# Patient Record
Sex: Female | Born: 1980 | Race: White | Hispanic: No | Marital: Married | Smoking: Never smoker
Health system: Southern US, Community
[De-identification: ages and names within clinical notes are randomized; demographics above are authoritative.]

## PROBLEM LIST (undated history)

## (undated) DIAGNOSIS — N946 Dysmenorrhea, unspecified: Secondary | ICD-10-CM

## (undated) DIAGNOSIS — Z8489 Family history of other specified conditions: Secondary | ICD-10-CM

## (undated) DIAGNOSIS — D649 Anemia, unspecified: Secondary | ICD-10-CM

## (undated) DIAGNOSIS — Z9889 Other specified postprocedural states: Secondary | ICD-10-CM

## (undated) DIAGNOSIS — N390 Urinary tract infection, site not specified: Secondary | ICD-10-CM

## (undated) DIAGNOSIS — K219 Gastro-esophageal reflux disease without esophagitis: Secondary | ICD-10-CM

## (undated) DIAGNOSIS — E282 Polycystic ovarian syndrome: Secondary | ICD-10-CM

## (undated) DIAGNOSIS — R112 Nausea with vomiting, unspecified: Secondary | ICD-10-CM

## (undated) DIAGNOSIS — T7840XA Allergy, unspecified, initial encounter: Secondary | ICD-10-CM

## (undated) DIAGNOSIS — E162 Hypoglycemia, unspecified: Secondary | ICD-10-CM

## (undated) DIAGNOSIS — O339 Maternal care for disproportion, unspecified: Secondary | ICD-10-CM

## (undated) DIAGNOSIS — B019 Varicella without complication: Secondary | ICD-10-CM

## (undated) DIAGNOSIS — R32 Unspecified urinary incontinence: Secondary | ICD-10-CM

## (undated) DIAGNOSIS — N1330 Unspecified hydronephrosis: Secondary | ICD-10-CM

## (undated) DIAGNOSIS — F329 Major depressive disorder, single episode, unspecified: Secondary | ICD-10-CM

## (undated) DIAGNOSIS — A630 Anogenital (venereal) warts: Secondary | ICD-10-CM

## (undated) DIAGNOSIS — F32A Depression, unspecified: Secondary | ICD-10-CM

## (undated) HISTORY — DX: Maternal care for disproportion, unspecified: O33.9

## (undated) HISTORY — DX: Urinary tract infection, site not specified: N39.0

## (undated) HISTORY — PX: APPENDECTOMY: SHX54

## (undated) HISTORY — DX: Unspecified urinary incontinence: R32

## (undated) HISTORY — DX: Anemia, unspecified: D64.9

## (undated) HISTORY — DX: Varicella without complication: B01.9

## (undated) HISTORY — DX: Unspecified hydronephrosis: N13.30

## (undated) HISTORY — DX: Gastro-esophageal reflux disease without esophagitis: K21.9

## (undated) HISTORY — DX: Depression, unspecified: F32.A

## (undated) HISTORY — DX: Anogenital (venereal) warts: A63.0

## (undated) HISTORY — DX: Major depressive disorder, single episode, unspecified: F32.9

## (undated) HISTORY — DX: Hypoglycemia, unspecified: E16.2

## (undated) HISTORY — DX: Allergy, unspecified, initial encounter: T78.40XA

## (undated) HISTORY — DX: Polycystic ovarian syndrome: E28.2

## (undated) HISTORY — PX: OTHER SURGICAL HISTORY: SHX169

## (undated) HISTORY — DX: Dysmenorrhea, unspecified: N94.6

---
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
## 2009-02-24 DIAGNOSIS — O09299 Supervision of pregnancy with other poor reproductive or obstetric history, unspecified trimester: Secondary | ICD-10-CM

## 2009-02-24 HISTORY — PX: CHOLECYSTECTOMY: SHX55

## 2012-06-07 ENCOUNTER — Ambulatory Visit (INDEPENDENT_AMBULATORY_CARE_PROVIDER_SITE_OTHER): Payer: 59 | Admitting: Internal Medicine

## 2012-06-07 ENCOUNTER — Encounter: Payer: Self-pay | Admitting: Internal Medicine

## 2012-06-07 VITALS — BP 90/60 | HR 78 | Temp 98.9°F | Ht 60.5 in | Wt 190.5 lb

## 2012-06-07 DIAGNOSIS — E282 Polycystic ovarian syndrome: Secondary | ICD-10-CM

## 2012-06-07 DIAGNOSIS — K219 Gastro-esophageal reflux disease without esophagitis: Secondary | ICD-10-CM

## 2012-06-07 DIAGNOSIS — Z9109 Other allergy status, other than to drugs and biological substances: Secondary | ICD-10-CM

## 2012-06-07 DIAGNOSIS — E162 Hypoglycemia, unspecified: Secondary | ICD-10-CM

## 2012-06-07 DIAGNOSIS — F32A Depression, unspecified: Secondary | ICD-10-CM

## 2012-06-07 DIAGNOSIS — F329 Major depressive disorder, single episode, unspecified: Secondary | ICD-10-CM

## 2012-06-07 DIAGNOSIS — N1330 Unspecified hydronephrosis: Secondary | ICD-10-CM

## 2012-06-07 MED ORDER — PANTOPRAZOLE SODIUM 40 MG PO TBEC: 40.0000 mg | DELAYED_RELEASE_TABLET | Freq: Every day | ORAL | Status: DC

## 2012-06-25 NOTE — Progress Notes (Signed)
Subjective:    Patient ID: Martha Miranda, female    DOB: 01/29/1981, 32 y.o.   MRN: [id number]  HPI 32 year old female with past history of GERD, PCOS, urinary incontinence and hydronephrosis requiring stent placement during pregnancy.  She comes in today to follow up on these issues as well as to establish care.  Takes zyrtec for her allergies.  Controls relatively well.  Has had allergy testing.  States she was in the [employer] for eight years and moved back here 8/13.  Seeing Dr Jean Rosenthal at [facility] for pap and pelvic.  Has a history of abnormal pap.  Had colposcopy (negative).  Also has a history of genital warts.  She had some issues with her pregnancy.  She had hyperemesis gravidarum and hydronephrosis when pregnant.  Had stents placed.  This occurred at 30 weeks of pregnancy.  Since childbirth, has not had issues with her kidney.  Stent pulled one week after delivery.  Kidney function is normal.  Has had her gallbladder removed.  Has had more regular bowel movements after surgery.  After her gallbladder was removed, she started having some chest pain.  Had EGD - esophagitis and positive H. Pylori.  Treated.  Took protonix.  No dysphagia.  Symptoms better.  Does have some issues with dairy products.  She reports problems with hypoglycemia.  Controls with diet.  No significant issues recently.    Past Medical History  Diagnosis Date  . Allergy   . GERD (gastroesophageal reflux disease)   . Chicken pox   . Depression   . Hypoglycemia   . Urinary incontinence   . UTI (urinary tract infection)   . Hydronephrosis   . Polycystic ovarian syndrome   . Genital warts     Outpatient Encounter Prescriptions as of 06/07/2012  Medication Sig Dispense Refill  . cetirizine (ZYRTEC) 10 MG tablet Take 10 mg by mouth daily.      . Multiple Vitamin (MULTIVITAMIN WITH MINERALS) TABS Take 1 tablet by mouth daily.      . Norgestimate-Ethinyl Estradiol Triphasic (ORTHO TRI-CYCLEN, 28,) 0.18/0.215/0.25 MG-35 MCG  tablet Take 1 tablet by mouth daily.      . pantoprazole (PROTONIX) 40 MG tablet Take 1 tablet (40 mg total) by mouth daily.  90 tablet  1  . [DISCONTINUED] pantoprazole (PROTONIX) 40 MG tablet Take 40 mg by mouth daily.       No facility-administered encounter medications on file as of 06/07/2012.    Review of Systems Patient denies any headache, lightheadedness or dizziness.  Takes zyrtec for her allergies.  No chest pain, tightness or palpitations.  No increased shortness of breath, cough or congestion.  Takes protonix for acid reflux.  No nausea or vomiting.  No abdominal pain or cramping.  No bowel change, such as diarrhea, constipation, BRBPR or melana.  Bowels doing better since her gall bladder surgery.  No urine change.        Objective:   Physical Exam Filed Vitals:   06/07/12 0926  BP: 90/60  Pulse: 78  Temp: 98.9 F (106.66 C)   32 year old female in no acute distress.   HEENT:  Nares- clear.  Oropharynx - without lesions. NECK:  Supple.  Nontender.  No audible bruit.  HEART:  Appears to be regular. LUNGS:  No crackles or wheezing audible.  Respirations even and unlabored.  RADIAL PULSE:  Equal bilaterally.    BREASTS:  Performed by GYN.   ABDOMEN:  Soft, nontender.  Bowel sounds present and  normal.  No audible abdominal bruit.  GU:  Performed by GYN.     EXTREMITIES:  No increased edema present.  DP pulses palpable and equal bilaterally.      SKIN:  No rash noted.     Assessment & Plan:  FAMILY HISTORY OF BREAST CANCER.  Maternal aunt with breast cancer.  Genetic marker negative.    GYN.  Has a history of a previous abnormal pap.  Colposcopy negative.  Is followed by Dr Jean Rosenthal at [facility]. States is up to date.    HEALTH MAINTENANCE.  Breast, pelvic and pap smears through [facility].  Obtain records and recent lab results prior to ordering labs.   I spent 45 minutes with this patient and more than 50% of the time was spent in consultation regarding the above.

## 2012-06-27 ENCOUNTER — Encounter: Payer: Self-pay | Admitting: Internal Medicine

## 2012-06-27 DIAGNOSIS — Z9109 Other allergy status, other than to drugs and biological substances: Secondary | ICD-10-CM | POA: Insufficient documentation

## 2012-06-27 DIAGNOSIS — E282 Polycystic ovarian syndrome: Secondary | ICD-10-CM | POA: Insufficient documentation

## 2012-06-27 DIAGNOSIS — N1330 Unspecified hydronephrosis: Secondary | ICD-10-CM | POA: Insufficient documentation

## 2012-06-27 DIAGNOSIS — K219 Gastro-esophageal reflux disease without esophagitis: Secondary | ICD-10-CM | POA: Insufficient documentation

## 2012-06-27 DIAGNOSIS — F419 Anxiety disorder, unspecified: Secondary | ICD-10-CM | POA: Insufficient documentation

## 2012-06-27 DIAGNOSIS — E162 Hypoglycemia, unspecified: Secondary | ICD-10-CM | POA: Insufficient documentation

## 2012-06-27 NOTE — Assessment & Plan Note (Signed)
Occurred during surgery.  Stent removed one week after delivery.  Doing well. Reports renal function is normal.   

## 2012-06-27 NOTE — Assessment & Plan Note (Signed)
Controls with diet.  Follow.   

## 2012-06-27 NOTE — Assessment & Plan Note (Signed)
Has had allergy testing.  Allergic to ragweed and mold.  Takes zyrtec. Follow.

## 2012-06-27 NOTE — Assessment & Plan Note (Signed)
On ortho tri-cyclen and doing well.  Follow.

## 2012-06-27 NOTE — Assessment & Plan Note (Addendum)
On Protonix.  No increased problems reported. Follow.  Previous EGD revealed esophagitis with positive H. Pylori.  Treated.    

## 2012-06-27 NOTE — Assessment & Plan Note (Signed)
Had some depression when she returned from [place].  Doing well now.  On no medication.  Follow.   

## 2012-07-30 ENCOUNTER — Encounter: Payer: Self-pay | Admitting: Internal Medicine

## 2012-07-30 ENCOUNTER — Ambulatory Visit (INDEPENDENT_AMBULATORY_CARE_PROVIDER_SITE_OTHER): Payer: 59 | Admitting: Internal Medicine

## 2012-07-30 VITALS — BP 100/60 | HR 74 | Temp 98.9°F | Ht 60.5 in | Wt 193.2 lb

## 2012-07-30 DIAGNOSIS — J069 Acute upper respiratory infection, unspecified: Secondary | ICD-10-CM

## 2012-07-30 DIAGNOSIS — J329 Chronic sinusitis, unspecified: Secondary | ICD-10-CM

## 2012-07-30 MED ORDER — AMOXICILLIN 875 MG PO TABS: 875.0000 mg | ORAL_TABLET | Freq: Two times a day (BID) | ORAL | Status: DC

## 2012-07-30 MED ORDER — TRIAMCINOLONE ACETONIDE(NASAL) 55 MCG/ACT NA INHA: 2.0000 | Freq: Every day | NASAL | Status: DC

## 2012-07-30 NOTE — Patient Instructions (Signed)
Amoxicillin (take one 2x/day).  nasacort - 2 sprays each nostril one time per day - do this in the evening.  Saline nasal spray - flush nose at least 2-3x/day.  Afrin nasal spray - 2 sprays each nostril 2x/dayfor three days only then stop.

## 2012-08-01 ENCOUNTER — Encounter: Payer: Self-pay | Admitting: Internal Medicine

## 2012-08-01 NOTE — Progress Notes (Signed)
Subjective:    Patient ID: Martha Miranda, female    DOB: Nov 13, 1980, 32 y.o.   MRN: [id number]  Sinusitis Associated symptoms include coughing.  Ear Fullness  Associated symptoms include coughing.  Cough  32 year old female with past history of GERD, PCOS, urinary incontinence and hydronephrosis requiring stent placement during pregnancy.  She comes in today as a work in with concerns regarding increased congestion and ear fullness associated with cough.  Symptoms started approximately 2-2.5 weeks ago.  Reports increased nasal congestion and sinus pressure.  Left side with increased pressure > right.  Left ear plugged.   Increased drainage.  No sore throat.  Increased cough and congestion.  Has tried sudafed, mucinex and saline.  Not improving.     Past Medical History  Diagnosis Date  . Allergy   . GERD (gastroesophageal reflux disease)   . Chicken pox   . Depression   . Hypoglycemia   . Urinary incontinence   . UTI (urinary tract infection)   . Hydronephrosis   . Polycystic ovarian syndrome   . Genital warts     Outpatient Encounter Prescriptions as of 07/30/2012  Medication Sig Dispense Refill  . Multiple Vitamin (MULTIVITAMIN WITH MINERALS) TABS Take 1 tablet by mouth daily.      . Norgestimate-Ethinyl Estradiol Triphasic (ORTHO TRI-CYCLEN, 28,) 0.18/0.215/0.25 MG-35 MCG tablet Take 1 tablet by mouth daily.      . pantoprazole (PROTONIX) 40 MG tablet Take 1 tablet (40 mg total) by mouth daily.  90 tablet  1  . amoxicillin (AMOXIL) 875 MG tablet Take 1 tablet (875 mg total) by mouth 2 (two) times daily.  20 tablet  0  . cetirizine (ZYRTEC) 10 MG tablet Take 10 mg by mouth daily.      Marland Kitchen triamcinolone (NASACORT AQ) 55 MCG/ACT nasal inhaler Place 2 sprays into the nose daily.  1 Inhaler  1   No facility-administered encounter medications on file as of 07/30/2012.    Review of Systems  Respiratory: Positive for cough.   Patient denies any headache, lightheadedness or dizziness.   Takes zyrtec for her allergies.  Has stopped the zyrtec with the increased congestion.   No chest pain, tightness or palpitations.  No increased shortness of breath.  Does report the increased sinus pressure and nasal congestion.  Increased drainage.  No sore throat.  Increased cough and congestion.  Chest feels tight.  Takes protonix for acid reflux.  No nausea or vomiting.  No abdominal pain or cramping.  No bowel change, such as diarrhea.        Objective:   Physical Exam  Filed Vitals:   07/30/12 0917  BP: 100/60  Pulse: 74  Temp: 98.9 F (36.47 C)   32 year old female in no acute distress.   HEENT:  Nares- erythematous turbinates.  Oropharynx - without lesions.  Increased tenderness to palpation over the maxillary sinus (left).  TMs without erythema.   NECK:  Supple.  Nontender.   HEART:  Appears to be regular. LUNGS:  No crackles or wheezing audible.  Respirations even and unlabored.      Assessment & Plan:  SINUSITIS/URI.  Will treat with amoxicillin 875mg  bid x 10 days.  Saline nasal spray and afrin as directed.  Will try nasacort nasal spray and see if she can tolerate.  mucinex and robitussin as directed.  Follow.  Notify me or be reevaluated if symptoms change, worsen or do not resolve.    FAMILY HISTORY OF BREAST CANCER.  Maternal aunt with breast cancer.  Genetic marker negative.    GYN.  Has a history of a previous abnormal pap.  Colposcopy negative.  Is followed by Dr Jean Rosenthal at [place]. States is up to date.    HEALTH MAINTENANCE.  Breast, pelvic and pap smears through [facility].  Obtain records and recent lab results prior to ordering labs.

## 2012-10-12 ENCOUNTER — Other Ambulatory Visit: Payer: Self-pay | Admitting: *Deleted

## 2012-10-12 MED ORDER — PANTOPRAZOLE SODIUM 40 MG PO TBEC: 40.0000 mg | DELAYED_RELEASE_TABLET | Freq: Every day | ORAL | Status: DC

## 2013-01-05 ENCOUNTER — Telehealth: Payer: Self-pay | Admitting: Internal Medicine

## 2013-01-05 NOTE — Telephone Encounter (Signed)
The patient will run out 11.13.14  triamcinolone (NASACORT AQ) 55 MCG/ACT nasal inhaler

## 2013-01-06 ENCOUNTER — Other Ambulatory Visit: Payer: Self-pay | Admitting: *Deleted

## 2013-01-06 MED ORDER — TRIAMCINOLONE ACETONIDE 55 MCG/ACT NA AERO: 2.0000 | INHALATION_SPRAY | Freq: Every day | NASAL | Status: DC

## 2013-01-06 NOTE — Telephone Encounter (Signed)
Sent Rx electronically

## 2013-01-28 ENCOUNTER — Encounter (INDEPENDENT_AMBULATORY_CARE_PROVIDER_SITE_OTHER): Payer: Self-pay

## 2013-01-28 ENCOUNTER — Ambulatory Visit (INDEPENDENT_AMBULATORY_CARE_PROVIDER_SITE_OTHER): Payer: 59 | Admitting: Internal Medicine

## 2013-01-28 ENCOUNTER — Encounter: Payer: Self-pay | Admitting: Internal Medicine

## 2013-01-28 VITALS — BP 90/60 | HR 70 | Temp 98.2°F | Ht 60.5 in | Wt 197.5 lb

## 2013-01-28 DIAGNOSIS — N63 Unspecified lump in unspecified breast: Secondary | ICD-10-CM

## 2013-01-28 DIAGNOSIS — R928 Other abnormal and inconclusive findings on diagnostic imaging of breast: Secondary | ICD-10-CM

## 2013-01-28 NOTE — Progress Notes (Signed)
Pre-visit discussion using our clinic review tool. No additional management support is needed unless otherwise documented below in the visit note.  

## 2013-01-28 NOTE — Progress Notes (Signed)
  Subjective:    Patient ID: Martha Miranda, female    DOB: [birth year], 32 y.o.   MRN: [id number]  HPI 33 year old female with past history of GERD, PCOS, urinary incontinence and hydronephrosis requiring stent placement during pregnancy.  She comes in today as a work in with concerns regarding a lump in her right breast.  States she first noticed something in her breast 01/16/13.  Describes it as pea sized.  Non tender.  Has not changed.  No nipple discharge.  She has two first cousins (ages 57 and 55) - breast cancer.  Also a maternal aunt who died of breast cancer - age 77.  Another aunt who has breast cancer - reoccurrence.  Her mother had genetic testing and is negative.     Past Medical History  Diagnosis Date  . Allergy   . GERD (gastroesophageal reflux disease)   . Chicken pox   . Depression   . Hypoglycemia   . Urinary incontinence   . UTI (urinary tract infection)   . Hydronephrosis   . Polycystic ovarian syndrome   . Genital warts     Outpatient Encounter Prescriptions as of 01/28/2013  Medication Sig  . cetirizine (ZYRTEC) 10 MG tablet Take 10 mg by mouth daily as needed.   . Multiple Vitamin (MULTIVITAMIN WITH MINERALS) TABS Take 1 tablet by mouth daily.  . Norgestimate-Ethinyl Estradiol Triphasic (ORTHO TRI-CYCLEN, 28,) 0.18/0.215/0.25 MG-35 MCG tablet Take 1 tablet by mouth daily.  . pantoprazole (PROTONIX) 40 MG tablet Take 1 tablet (40 mg total) by mouth daily.  Marland Kitchen triamcinolone (NASACORT) 55 MCG/ACT AERO nasal inhaler Place 2 sprays into the nose daily.  . [DISCONTINUED] amoxicillin (AMOXIL) 875 MG tablet Take 1 tablet (875 mg total) by mouth 2 (two) times daily.    Review of Systems reports noticing a pea sized lump in her breast.  Non tender.  Does breast exams monthly.  This is a change for her.  No change in the size.  No nipple discharge.  Persistent since 01/16/13.        Objective:   Physical Exam  Filed Vitals:   01/28/13 1401  BP: 90/60  Pulse: 70   Temp: 98.2 F (62.56 C)   32 year old female in no acute distress.  NECK:  Supple.  Nontender.  No lymphadenopathy.   BREASTS:  No nipple discharge or nipple retraction.  Palpable pea sized nodule in the right breast - 11:00 position.  Outer breast.  Non tender.  Could not appreciate any other nodules or axillary adenopathy.       Assessment & Plan:  FAMILY HISTORY OF BREAST CANCER.  Maternal aunt with breast cancer.  Genetic marker negative.

## 2013-01-30 ENCOUNTER — Encounter: Payer: Self-pay | Admitting: Internal Medicine

## 2013-01-30 DIAGNOSIS — N63 Unspecified lump in unspecified breast: Secondary | ICD-10-CM | POA: Insufficient documentation

## 2013-01-30 NOTE — Assessment & Plan Note (Signed)
Persistent since 01/16/13 - when she first noticed.  Due to start her period next week.  Unchanged.  Non tender.  Strong family history.  Will check diagnostic mammogram with ultrasound.  Further w/up pending results.

## 2013-02-09 ENCOUNTER — Encounter: Payer: Self-pay | Admitting: Internal Medicine

## 2013-02-09 ENCOUNTER — Ambulatory Visit: Payer: Self-pay | Admitting: Internal Medicine

## 2013-02-09 LAB — HM MAMMOGRAPHY: HM Mammogram: NEGATIVE

## 2013-02-15 ENCOUNTER — Encounter: Payer: Self-pay | Admitting: *Deleted

## 2013-05-03 ENCOUNTER — Telehealth: Payer: Self-pay | Admitting: Internal Medicine

## 2013-05-03 ENCOUNTER — Emergency Department: Payer: Self-pay | Admitting: Emergency Medicine

## 2013-05-03 LAB — COMPREHENSIVE METABOLIC PANEL
ALBUMIN: 3.9 g/dL (ref 3.4–5.0)
ALT: 15 U/L (ref 12–78)
ANION GAP: 4 — AB (ref 7–16)
Alkaline Phosphatase: 95 U/L
BUN: 10 mg/dL (ref 7–18)
Bilirubin,Total: 0.3 mg/dL (ref 0.2–1.0)
CREATININE: 0.73 mg/dL (ref 0.60–1.30)
Calcium, Total: 9 mg/dL (ref 8.5–10.1)
Chloride: 106 mmol/L (ref 98–107)
Co2: 27 mmol/L (ref 21–32)
EGFR (African American): >60
Glucose: 100 mg/dL — ABNORMAL HIGH (ref 65–99)
Osmolality: 273 (ref 275–301)
Potassium: 3.6 mmol/L (ref 3.5–5.1)
SGOT(AST): 16 U/L (ref 15–37)
SODIUM: 137 mmol/L (ref 136–145)
TOTAL PROTEIN: 7.5 g/dL (ref 6.4–8.2)

## 2013-05-03 LAB — CBC WITH DIFFERENTIAL/PLATELET
BASOS PCT: 0.6 %
Basophil #: 0 10*3/uL (ref 0.0–0.1)
EOS ABS: 0.2 10*3/uL (ref 0.0–0.7)
EOS PCT: 3.2 %
HCT: 42.6 % (ref 35.0–47.0)
HGB: 14.5 g/dL (ref 12.0–16.0)
Lymphocyte #: 1.4 10*3/uL (ref 1.0–3.6)
Lymphocyte %: 25.9 %
MCH: 29.6 pg (ref 26.0–34.0)
MCHC: 34 g/dL (ref 32.0–36.0)
MCV: 87 fL (ref 80–100)
Monocyte #: 0.6 x10 3/mm (ref 0.2–0.9)
Monocyte %: 10.8 %
NEUTROS ABS: 3.1 10*3/uL (ref 1.4–6.5)
Neutrophil %: 59.5 %
Platelet: 271 10*3/uL (ref 150–440)
RBC: 4.89 10*6/uL (ref 3.80–5.20)
RDW: 13.5 % (ref 11.5–14.5)
WBC: 5.3 10*3/uL (ref 3.6–11.0)

## 2013-05-03 LAB — URINALYSIS, COMPLETE
Bilirubin,UR: NEGATIVE
Glucose,UR: NEGATIVE mg/dL (ref 0–75)
Ketone: NEGATIVE
Leukocyte Esterase: NEGATIVE
NITRITE: NEGATIVE
PROTEIN: NEGATIVE
Ph: 7 (ref 4.5–8.0)
RBC,UR: <1 /HPF (ref 0–5)
SPECIFIC GRAVITY: 1.003 (ref 1.003–1.030)
Squamous Epithelial: 2
WBC UR: 1 /HPF (ref 0–5)

## 2013-05-03 LAB — LIPASE, BLOOD: Lipase: 75 U/L (ref 73–393)

## 2013-05-03 LAB — WET PREP, GENITAL

## 2013-05-03 LAB — PREGNANCY, URINE: PREGNANCY TEST, URINE: NEGATIVE m[IU]/mL

## 2013-05-03 NOTE — Telephone Encounter (Signed)
Noted  

## 2013-05-03 NOTE — Telephone Encounter (Signed)
Patient Information:  Caller Name: Delona  Phone: [phone]  Patient: Martha Miranda, Martha Miranda  Gender: Female  DOB: [birth year]  Age: 33 Years  PCP: Einar Pheasant  Pregnant: No  Office Follow Up:  Does the office need to follow up with this patient?: No  Instructions For The Office: N/A  RN Note:  Onset of right flank pain radiating down into right abdomen 05/02/13.  Temp 103.5 O.  Emesis x 1 PM 05/02/13.  Per flank pain protocol, advised ED now due to presence of vomiting; patient agrees.  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  krs/can  Symptoms  Reason For Call & Symptoms: flank pain; has had stents placed in past  Reviewed Health History In EMR: Yes  Reviewed Medications In EMR: Yes  Reviewed Allergies In EMR: Yes  Reviewed Surgeries / Procedures: Yes  Date of Onset of Symptoms: 05/02/2013 OB / GYN:  LMP: 04/28/2013  Guideline(s) Used:  Flank Pain  Disposition Per Guideline:   Go to ED Now (or to Office with PCP Approval)  Reason For Disposition Reached:   Vomiting  Advice Given:  N/A  Patient Will Follow Care Advice:  YES

## 2013-05-03 NOTE — Telephone Encounter (Signed)
FYI

## 2013-05-09 ENCOUNTER — Ambulatory Visit: Payer: Self-pay | Admitting: Obstetrics & Gynecology

## 2013-05-09 LAB — CBC
HCT: 42 % (ref 35.0–47.0)
HGB: 13.8 g/dL (ref 12.0–16.0)
MCH: 29 pg (ref 26.0–34.0)
MCHC: 32.9 g/dL (ref 32.0–36.0)
MCV: 88 fL (ref 80–100)
PLATELETS: 329 10*3/uL (ref 150–440)
RBC: 4.78 10*6/uL (ref 3.80–5.20)
RDW: 13.6 % (ref 11.5–14.5)
WBC: 8.1 10*3/uL (ref 3.6–11.0)

## 2013-05-09 LAB — PREGNANCY, URINE: Pregnancy Test, Urine: NEGATIVE m[IU]/mL

## 2013-05-19 ENCOUNTER — Ambulatory Visit: Payer: Self-pay | Admitting: Obstetrics & Gynecology

## 2013-05-19 HISTORY — PX: RIGHT OOPHORECTOMY: SHX2359

## 2013-05-23 LAB — PATHOLOGY REPORT

## 2013-06-07 ENCOUNTER — Ambulatory Visit: Payer: 59 | Admitting: Internal Medicine

## 2013-07-08 ENCOUNTER — Other Ambulatory Visit: Payer: Self-pay | Admitting: Internal Medicine

## 2013-08-02 ENCOUNTER — Ambulatory Visit (INDEPENDENT_AMBULATORY_CARE_PROVIDER_SITE_OTHER): Payer: 59 | Admitting: Internal Medicine

## 2013-08-02 ENCOUNTER — Encounter: Payer: Self-pay | Admitting: Internal Medicine

## 2013-08-02 VITALS — BP 110/70 | HR 86 | Temp 98.7°F | Ht 60.5 in | Wt 197.8 lb

## 2013-08-02 DIAGNOSIS — F32A Depression, unspecified: Secondary | ICD-10-CM

## 2013-08-02 DIAGNOSIS — E282 Polycystic ovarian syndrome: Secondary | ICD-10-CM

## 2013-08-02 DIAGNOSIS — R928 Other abnormal and inconclusive findings on diagnostic imaging of breast: Secondary | ICD-10-CM

## 2013-08-02 DIAGNOSIS — Z9109 Other allergy status, other than to drugs and biological substances: Secondary | ICD-10-CM

## 2013-08-02 DIAGNOSIS — N200 Calculus of kidney: Secondary | ICD-10-CM

## 2013-08-02 DIAGNOSIS — D369 Benign neoplasm, unspecified site: Secondary | ICD-10-CM

## 2013-08-02 DIAGNOSIS — K219 Gastro-esophageal reflux disease without esophagitis: Secondary | ICD-10-CM

## 2013-08-02 DIAGNOSIS — N63 Unspecified lump in unspecified breast: Secondary | ICD-10-CM

## 2013-08-02 DIAGNOSIS — F3289 Other specified depressive episodes: Secondary | ICD-10-CM

## 2013-08-02 DIAGNOSIS — N1330 Unspecified hydronephrosis: Secondary | ICD-10-CM

## 2013-08-02 DIAGNOSIS — F329 Major depressive disorder, single episode, unspecified: Secondary | ICD-10-CM

## 2013-08-02 MED ORDER — PANTOPRAZOLE SODIUM 40 MG PO TBEC: 40.0000 mg | DELAYED_RELEASE_TABLET | Freq: Every day | ORAL | Status: DC

## 2013-08-02 MED ORDER — TRIAMCINOLONE ACETONIDE 55 MCG/ACT NA AERO: 2.0000 | INHALATION_SPRAY | Freq: Every day | NASAL | Status: DC

## 2013-08-02 NOTE — Progress Notes (Signed)
Pre visit review using our clinic review tool, if applicable. No additional management support is needed unless otherwise documented below in the visit note. 

## 2013-08-07 ENCOUNTER — Encounter: Payer: Self-pay | Admitting: Internal Medicine

## 2013-08-07 DIAGNOSIS — D369 Benign neoplasm, unspecified site: Secondary | ICD-10-CM | POA: Insufficient documentation

## 2013-08-07 DIAGNOSIS — N200 Calculus of kidney: Secondary | ICD-10-CM | POA: Insufficient documentation

## 2013-08-07 NOTE — Assessment & Plan Note (Signed)
On Protonix.  No increased problems reported. Follow.  Previous EGD revealed esophagitis with positive H. Pylori.  Treated.

## 2013-08-07 NOTE — Assessment & Plan Note (Signed)
Occurred during surgery.  Stent removed one week after delivery.  Doing well. Reports renal function is normal.

## 2013-08-07 NOTE — Assessment & Plan Note (Signed)
Recent mammogram negative.  Area smaller (per her report).  Plans to f/u with breast exam through gyn.

## 2013-08-07 NOTE — Assessment & Plan Note (Signed)
Recently passed a stone.  Has done well since.  Follow.

## 2013-08-07 NOTE — Assessment & Plan Note (Addendum)
On ortho tri-cyclen and doing well.  Follow.  Planning to see gyn to discuss pregnancy.

## 2013-08-07 NOTE — Assessment & Plan Note (Signed)
Doing well on nasacort.  Follow.

## 2013-08-07 NOTE — Assessment & Plan Note (Signed)
S/p right oophorectomy.  Doing well.  Follow.

## 2013-08-07 NOTE — Assessment & Plan Note (Signed)
Had some depression when she returned from [place].  Doing well now.  On no medication.  Follow.

## 2013-08-07 NOTE — Progress Notes (Signed)
Subjective:    Patient ID: Martha Miranda, female    DOB: 06/24/1980, 33 y.o.   MRN: [id number]  HPI 33 year old female with past history of GERD, PCOS, urinary incontinence and hydronephrosis requiring stent placement during pregnancy.  She comes in today to follow up on these issues as well as for a complete physical exam.  She recently was evaluated for breast lump.  Mammogram - ok.  No problems now.  States area is smaller.  She is due to see her gyn later today.  Plans to get them to do a breast exam.  She declines breast exam here - wants to wait until gyn appt.  Previously had right flank pain.  To ER.  CT revealed a dermoid tumor.  Is now s/p right oophorectomy.  On OCPs (orthotricyclen).  Doing well.  No pain.  Did pass a stone.  No further problems since.  She is using nasacort and allergy symptoms are much improved.  Feels better.     Past Medical History  Diagnosis Date  . Allergy   . GERD (gastroesophageal reflux disease)   . Chicken pox   . Depression   . Hypoglycemia   . Urinary incontinence   . UTI (urinary tract infection)   . Hydronephrosis   . Polycystic ovarian syndrome   . Genital warts     Outpatient Encounter Prescriptions as of 08/02/2013  Medication Sig  . cetirizine (ZYRTEC) 10 MG tablet Take 10 mg by mouth daily as needed.   . Multiple Vitamin (MULTIVITAMIN WITH MINERALS) TABS Take 1 tablet by mouth daily.  . Norgestimate-Ethinyl Estradiol Triphasic (ORTHO TRI-CYCLEN, 28,) 0.18/0.215/0.25 MG-35 MCG tablet Take 1 tablet by mouth daily.  . pantoprazole (PROTONIX) 40 MG tablet Take 1 tablet (40 mg total) by mouth daily.  Marland Kitchen triamcinolone (NASACORT) 55 MCG/ACT AERO nasal inhaler Place 2 sprays into the nose daily.  . [DISCONTINUED] pantoprazole (PROTONIX) 40 MG tablet Take 1 tablet (40 mg total) by mouth daily.  . [DISCONTINUED] triamcinolone (NASACORT) 55 MCG/ACT AERO nasal inhaler Place 2 sprays into the nose daily.    Review of Systems Patient denies any  headache, lightheadedness or dizziness.  Allergy symptoms better.  No chest pain, tightness or palpitations.  No increased shortness of breath, cough or congestion.  No nausea or vomiting.  No acid reflux.  No abdominal pain or cramping.  No bowel change, such as diarrhea, constipation, BRBPR or melana.  No urine change.   Dong well on OCPs.  Doing well since her surgery.          Objective:   Physical Exam  Filed Vitals:   08/02/13 1328  BP: 110/70  Pulse: 86  Temp: 98.7 F (28.51 C)   33 year old female in no acute distress.   HEENT:  Nares- clear.  Oropharynx - without lesions. NECK:  Supple.  Nontender.  No audible bruit.  HEART:  Appears to be regular. LUNGS:  No crackles or wheezing audible.  Respirations even and unlabored.  RADIAL PULSE:  Equal bilaterally.    BREASTS:  To be performed by gyn.  ABDOMEN:  Soft, nontender.  Bowel sounds present and normal.  No audible abdominal bruit.  GU:  To be performed by gyn.    EXTREMITIES:  No increased edema present.  DP pulses palpable and equal bilaterally.           Assessment & Plan:  FAMILY HISTORY OF BREAST CANCER.  Maternal aunt with breast cancer.  Genetic marker negative.  HEALTH MAINTENANCE.  Physical today.  She gets her breast, pelvic and pap smears through gyn.  Obtain records.  To be done today.

## 2014-04-05 ENCOUNTER — Observation Stay: Payer: Self-pay | Admitting: Obstetrics and Gynecology

## 2014-04-14 ENCOUNTER — Other Ambulatory Visit: Payer: Self-pay | Admitting: Internal Medicine

## 2014-05-09 ENCOUNTER — Ambulatory Visit: Payer: Self-pay | Admitting: Obstetrics and Gynecology

## 2014-05-18 ENCOUNTER — Ambulatory Visit: Payer: Self-pay | Admitting: Obstetrics and Gynecology

## 2014-05-18 LAB — CBC WITH DIFFERENTIAL/PLATELET
BASOS ABS: 0 10*3/uL (ref 0.0–0.1)
Basophil %: 0.2 %
EOS ABS: 0.1 10*3/uL (ref 0.0–0.7)
Eosinophil %: 1 %
HCT: 33.6 % — ABNORMAL LOW (ref 35.0–47.0)
HGB: 11.2 g/dL — ABNORMAL LOW (ref 12.0–16.0)
Lymphocyte #: 1.5 10*3/uL (ref 1.0–3.6)
Lymphocyte %: 14.2 %
MCH: 27.8 pg (ref 26.0–34.0)
MCHC: 33.4 g/dL (ref 32.0–36.0)
MCV: 83 fL (ref 80–100)
MONOS PCT: 6.6 %
Monocyte #: 0.7 x10 3/mm (ref 0.2–0.9)
Neutrophil #: 8.4 10*3/uL — ABNORMAL HIGH (ref 1.4–6.5)
Neutrophil %: 78 %
PLATELETS: 212 10*3/uL (ref 150–440)
RBC: 4.03 10*6/uL (ref 3.80–5.20)
RDW: 13.9 % (ref 11.5–14.5)
WBC: 10.7 10*3/uL (ref 3.6–11.0)

## 2014-05-19 ENCOUNTER — Inpatient Hospital Stay: Payer: Self-pay | Admitting: Obstetrics and Gynecology

## 2014-05-20 LAB — HEMATOCRIT: HCT: 25.6 % — ABNORMAL LOW (ref 35.0–47.0)

## 2014-05-24 ENCOUNTER — Encounter: Payer: Self-pay | Admitting: *Deleted

## 2014-06-17 NOTE — Op Note (Signed)
PATIENT NAME:  Martha Miranda, Martha Miranda MR#:  [id number] DATE OF BIRTH:  1981/01/19  DATE OF PROCEDURE:  05/19/2013  PREOPERATIVE DIAGNOSIS: Ovarian cysts bilaterally.   POSTOPERATIVE DIAGNOSIS: Bilateral dermoid ovarian cysts.   PROCEDURES PERFORMED: Operative laparoscopy with right oophorectomy, left ovarian cystectomy and chromopertubation.   SURGEON: Glean Salen, MD  ANESTHESIA: General.   ESTIMATED BLOOD LOSS: Minimal.   COMPLICATIONS: None.   FINDINGS: Bilateral dermoid cysts with right side larger and more complete involvement of the ovary and this one was also recurrent. Left side is easily separable from the normal ovarian cortical tissue. Dye was able to be passed through the left tube easily and did not appear to pass through the right tube. There were no visual tubal disruptions or disease; normal uterus.   DISPOSITION: To recovery room stable.   TECHNIQUE: The patient is prepped and draped in the usual sterile fashion after adequate anesthesia is obtained in the dorsal lithotomy position. Bladder is drained with a Robinson catheter. A uterine manipulator is placed through the cervix and then the uterus for manipulation purposes and also for passage of methylene blue dye.   Attention is then turned to the abdomen where a Veress needle is inserted from an infraumbilical incision after Marcaine is used to anesthetize the skin. Veress needle placement is confirmed using the hanging drop technique and the abdomen is then insufflated with CO2 gas. A 5-mm trocar is then inserted under direct visualization with the laparoscope with no injuries or bleeding noted. The patient is placed in Trendelenburg positioning. Please see above findings for complete detail of the pathology visualized.   A 5-mm millimeter trocar is placed in the left lower quadrant lateral to the inferior epigastric blood vessels and an 11-mm trocar is placed in the suprapubic region under direct visualization with the  laparoscope with no injuries or bleeding noted. The right ovary is carefully grasped and dissection of the ovary is performed with preservation of the fallopian tube without disruption. The ovarian artery and vein are carefully coagulated and cut and the remaining ligamentous structures supporting the ovary are carefully dissected using the Harmonic scalpel until it is completely amputated. The left ovary is then grasped. It is lanced to identify what type of cyst it is and immediately there is hair visualized within this cyst consistent with dermoid. A little bit more portion of the outer cyst wall is cut and then it is carefully peeled out in its entirety so that the dermoid cyst is completely removed with preservation of the left ovary and most of the ovarian cortical tissue. There is minimal spillage from either ovary during the dissection of them free other above-mentioned tissues. The remaining left ovary is viable, is well perfused and has no bleeding whatsoever.   The pelvic cavity is irrigated with copious amounts of fluid. With aspiration of the fluid, no bleeding is noted. The left ovarian cyst is placed in an Endopouch and removed through the suprapubic port incision without difficulty. The right ovary is placed in an Endopouch and is also removed through the suprapubic site, but the site has to be extended in order to completely remove this ovary to approximately 3 cm in length. The patient is leveled. Trocars are removed. Gas is expelled. The rectus fascia at the suprapubic port site is closed with a 2-0 Vicryl suture. Subcutaneous tissues are irrigated. Hemostasis is visualized. Skin is closed with 4-0 Vicryl suture in a subcuticular fashion. This site as well the other two laparoscopic sites are  then closed with Dermabond at the level of the skin. The manipulator is removed without difficulty.   Prior to removing the ovaries, the chromopertubation was performed. Methylene blue dye is injected  through uterine manipulator into the uterus and out the fallopian tubes. It is seen to extrude out the left fimbria of the fallopian tube. On the right side, although dye is seen to pass through the tissues, it is not seen to emit through the fimbria of the fallopian tube. There were no visual abnormalities with either fallopian tube.   The patient goes to the recovery room in stable condition. All sponge, instrument and needle counts are correct.     ____________________________ R. Barnett Applebaum, MD rph:lm D: 05/19/2013 18:04:59 ET T: 05/19/2013 20:02:42 ET JOB#: [id number]  cc: Glean Salen, MD, <Dictator> Gae Dry MD ELECTRONICALLY SIGNED 05/20/2013 10:11

## 2014-06-25 NOTE — Op Note (Signed)
PATIENT NAME:  Martha Miranda, Martha Miranda MR#:  [id number] DATE OF BIRTH:  September 29, 1980  DATE OF PROCEDURE:  05/19/2014  PREOPERATIVE DIAGNOSES:  1.  History of cephalopelvic proportion in prior pregnancy with 4th degree tear.  2.  Elective primary Cesarean section.   3.  Obesity in pregnancy.   POSTOPERATIVE DIAGNOSES:  1.  History of cephalopelvic proportion in prior pregnancy with 4th degree tear.  2.  Elective primary Cesarean section.   3.  Obesity in pregnancy.   OPERATION:  Low transverse Cesarean section.   ANESTHESIA: Spinal.   SURGEON:  Khady Vandenberg S. Marcelline Mates, MD.    ESTIMATED BLOOD LOSS: 600 mL.   OPERATIVE FLUIDS: 2000 mL.   URINE OUTPUT: 100 mL.   COMPLICATIONS: None.   FINDINGS: Female infant in ROA position, cephalic presentation, at 7 pounds, 14 ounces with Apgars of 8 and 9. No nuchal cord was present. There was clear amniotic fluid at rupture. Normal-appearing uterine outline, left fallopian tube and ovary, the right adnexa is surgically absent, and normal-appearing placenta.   SPECIMEN TYPE: Cord blood.   CONDITION: Stable.   PROCEDURE: The patient was taken to the operating room where she was placed under spinal anesthesia without difficulty. She was then prepped and draped in normal sterile fashion.  A Foley catheter was placed. Next a timeout was held and the patient was identified as Presenter, broadcasting and the procedure was identified and primary low transverse C-section.  After induction of anesthesia the patient received 2 grams of Ancef. The spinal was then tested and found to be adequate. A Pfannenstiel skin incision was made and carried down through the subcutaneous tissue to the level of the fascia. The fascia was then incised in the midline and the incision was extended laterally. The superior aspect of the fascial incision was then grasped with Kocher clamps, elevated, and underlying rectus tissues were dissected off. Attention was then turned inferiorly, which in a similar  fashion was grasped, tented up with Kocher clamps, and the rectus muscles again were dissected off bluntly. The rectus muscles were separated in the midline. The peritoneum was identified, grasped, and entered sharply using hemostats. The peritoneal incision was then extended superiorly and inferiorly with good visualization of the bladder. The uterovesical peritoneal reflection was then incised transversely and the bladder flap was bluntly freed from the lower uterine segment. The bladder blade was then inserted. A low transverse uterine incision was made along the lower uterine segment with the scalpel. The uterine incision was then extended laterally and superiorly bluntly. The bladder blade was then removed and the infant's head was delivered atraumatically. There was no nuchal cord present. The nose and mouth were suctioned and the infant was handed off to the pediatricians in attendance. Delivered from vertex presentation was a 7-pound, 14-ounce female with Apgars of 8 and 9 at 1 and 5 minutes respectively. After the umbilical cord was clamped and cut cord blood was obtained for evaluation. After this more cord blood was collected in a separate receptacle for cord blood banking. After this the placenta was removed manually and intact and appeared normal. The uterus was exteriorized and cleared of all clots and debris. The uterine outline, left fallopian tube, and ovary were noted to be normal The remnant of the right fallopian tube was also normal. The uterine incision was then closed with running locked suture of 0 Vicryl. A second imbricating layer was performed using another suture of 0 Vicryl for added hemostasis. Hemostasis was achieved at this point. The  uterus was then returned to the patient's abdomen. The gutters were cleared of all clots and debris. Next the fascia was then closed using running suture of 1-0 Vicryl. The subcutaneous fat was reapproximated with 3-0 Vicryl in a running fashion. The  skin was approximated with 4-0 Monocryl. LiquiBand was then placed over the incision and a pressure dressing was placed. Next a Lidoderm patch was placed above and below the patient's incision line. Instrument, sponge, and needle counts were correct prior to abdominal closure and at the conclusion of the case. The patient and mother were taken to the recovery room in stable condition.    ____________________________ Chesley Noon Marcelline Mates, MD asc:bu D: 05/19/2014 10:33:29 ET T: 05/19/2014 14:25:58 ET JOB#: [id number]  cc: Chesley Noon. Marcelline Mates, MD, <Dictator> Augusto Gamble MD ELECTRONICALLY SIGNED 06/05/2014 11:24

## 2014-07-06 ENCOUNTER — Telehealth: Payer: Self-pay | Admitting: *Deleted

## 2014-07-06 ENCOUNTER — Other Ambulatory Visit: Payer: Self-pay | Admitting: *Deleted

## 2014-07-06 NOTE — Telephone Encounter (Signed)
Ok thanks will notify pt

## 2014-07-06 NOTE — Telephone Encounter (Signed)
Pt had Protonix refill sent in on 04/14/14 to [place] with 5 additional refills. She should have enough refills to last her until 10/13/14.

## 2014-08-07 ENCOUNTER — Encounter: Payer: 59 | Admitting: Internal Medicine

## 2014-09-28 ENCOUNTER — Other Ambulatory Visit
Admission: RE | Admit: 2014-09-28 | Discharge: 2014-09-28 | Disposition: A | Discharge status: Home / Self Care | Payer: 59 | Source: Other Acute Inpatient Hospital | Attending: Internal Medicine | Admitting: Internal Medicine

## 2014-09-28 DIAGNOSIS — N390 Urinary tract infection, site not specified: Secondary | ICD-10-CM | POA: Diagnosis not present

## 2014-09-30 LAB — URINE CULTURE: Culture: >=100000

## 2014-10-17 ENCOUNTER — Other Ambulatory Visit: Payer: Self-pay | Admitting: Internal Medicine

## 2014-10-23 ENCOUNTER — Encounter: Payer: Self-pay | Admitting: Internal Medicine

## 2014-10-23 ENCOUNTER — Ambulatory Visit (INDEPENDENT_AMBULATORY_CARE_PROVIDER_SITE_OTHER): Payer: 59 | Admitting: Internal Medicine

## 2014-10-23 VITALS — BP 117/65 | HR 91 | Temp 98.4°F | Ht 60.25 in | Wt 223.2 lb

## 2014-10-23 DIAGNOSIS — L659 Nonscarring hair loss, unspecified: Secondary | ICD-10-CM

## 2014-10-23 DIAGNOSIS — Z9109 Other allergy status, other than to drugs and biological substances: Secondary | ICD-10-CM

## 2014-10-23 DIAGNOSIS — R413 Other amnesia: Secondary | ICD-10-CM

## 2014-10-23 DIAGNOSIS — E282 Polycystic ovarian syndrome: Secondary | ICD-10-CM | POA: Diagnosis not present

## 2014-10-23 DIAGNOSIS — R42 Dizziness and giddiness: Secondary | ICD-10-CM

## 2014-10-23 DIAGNOSIS — D649 Anemia, unspecified: Secondary | ICD-10-CM

## 2014-10-23 DIAGNOSIS — N390 Urinary tract infection, site not specified: Secondary | ICD-10-CM

## 2014-10-23 DIAGNOSIS — Z91048 Other nonmedicinal substance allergy status: Secondary | ICD-10-CM

## 2014-10-23 LAB — COMPREHENSIVE METABOLIC PANEL
ALT: 10 U/L (ref 0–35)
AST: 13 U/L (ref 0–37)
Albumin: 4.1 g/dL (ref 3.5–5.2)
Alkaline Phosphatase: 75 U/L (ref 39–117)
BUN: 14 mg/dL (ref 6–23)
CO2: 27 mEq/L (ref 19–32)
Calcium: 8.8 mg/dL (ref 8.4–10.5)
Chloride: 104 mEq/L (ref 96–112)
Creatinine, Ser: 0.76 mg/dL (ref 0.40–1.20)
GFR: 92.91 mL/min (ref >60.00)
Glucose, Bld: 93 mg/dL (ref 70–99)
Potassium: 4 mEq/L (ref 3.5–5.1)
Sodium: 141 mEq/L (ref 135–145)
Total Bilirubin: 0.3 mg/dL (ref 0.2–1.2)
Total Protein: 6.7 g/dL (ref 6.0–8.3)

## 2014-10-23 LAB — CBC WITH DIFFERENTIAL/PLATELET
BASOS ABS: 0 10*3/uL (ref 0.0–0.1)
Basophils Relative: 0.3 % (ref 0.0–3.0)
EOS PCT: 2 % (ref 0.0–5.0)
Eosinophils Absolute: 0.2 10*3/uL (ref 0.0–0.7)
HCT: 34 % — ABNORMAL LOW (ref 36.0–46.0)
HEMOGLOBIN: 11.1 g/dL — AB (ref 12.0–15.0)
LYMPHS ABS: 2.2 10*3/uL (ref 0.7–4.0)
Lymphocytes Relative: 22 % (ref 12.0–46.0)
MCHC: 32.7 g/dL (ref 30.0–36.0)
MCV: 76.2 fl — AB (ref 78.0–100.0)
MONOS PCT: 4.8 % (ref 3.0–12.0)
Monocytes Absolute: 0.5 10*3/uL (ref 0.1–1.0)
NEUTROS PCT: 70.9 % (ref 43.0–77.0)
Neutro Abs: 6.9 10*3/uL (ref 1.4–7.7)
Platelets: 365 10*3/uL (ref 150.0–400.0)
RBC: 4.46 Mil/uL (ref 3.87–5.11)
RDW: 17 % — ABNORMAL HIGH (ref 11.5–15.5)
WBC: 9.8 10*3/uL (ref 4.0–10.5)

## 2014-10-23 LAB — TSH: TSH: 0.49 u[IU]/mL (ref 0.35–4.50)

## 2014-10-23 LAB — FERRITIN: FERRITIN: 6 ng/mL — AB (ref 10.0–291.0)

## 2014-10-23 LAB — T3, FREE: T3, Free: 3.6 pg/mL (ref 2.3–4.2)

## 2014-10-23 LAB — T4, FREE: Free T4: 0.82 ng/dL (ref 0.60–1.60)

## 2014-10-23 NOTE — Progress Notes (Signed)
Pre-visit discussion using our clinic review tool. No additional management support is needed unless otherwise documented below in the visit note.  

## 2014-10-23 NOTE — Progress Notes (Signed)
Patient ID: Martha Miranda, female   DOB: 1980-08-14, 34 y.o.   MRN: [id number]   Subjective:    Patient ID: Martha Miranda, female    DOB: [birth year], 34 y.o.   MRN: [id number]  HPI  Patient here for a scheduled physical exam.  She gets her breasts, pelvic and pap smears at gyn.  Is up to date.  Just had a baby.  Baby doing well.  Reports has noticed increased hair loss.  States is coming out in clumps.  She has cut back on food iintake.  Still has not lost weight.  She is not sleeping well.  This has been present since her baby was born.  Having dizzy spells.  Described as room spinning.  New glasses have helped.  Has had urinary tract infections.  Recently treated with cipro and the dizziness has resolved.  (after completing abx).  She is also on nasacort.  This helps.  No chest pain or tightness.  No sob.  Bowels stable.     Past Medical History  Diagnosis Date  . Allergy   . GERD (gastroesophageal reflux disease)   . Chicken pox   . Depression   . Hypoglycemia   . Urinary incontinence   . UTI (urinary tract infection)   . Hydronephrosis   . Polycystic ovarian syndrome   . Genital warts    Past Surgical History  Procedure Laterality Date  . Utetral stent placement w/ removal    . 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 Tonsillectomy and adenoidectomy  1988  . Cholecystectomy  2011  . Partial oophorectomy      removed 1/2 of right ovary  . Right oophorectomy  05/19/2013    & bitaleral tubal removal   Family History  Problem Relation Age of Onset  . Arthritis Mother   . Mental illness Mother   . Stroke Father   . High blood pressure Father   . Mental illness Father   . Diabetes Father   . Breast cancer Maternal Aunt   . Arthritis Maternal Grandmother   . High blood pressure Maternal Grandmother   . Diabetes Maternal Grandmother   . Dementia Maternal Grandmother   . High blood pressure Maternal Grandfather   . Diabetes Maternal Grandfather   . Arthritis  Paternal Grandmother   . High blood pressure Paternal Grandmother   . Kidney disease Paternal Grandmother   . Diabetes Paternal Grandmother   . Mental illness Paternal Grandfather    Social History   Social History  . Marital Status: Married    Spouse Name: N/A  . Number of Children: 1  . Years of Education: N/A   Social History Main Topics  . Smoking status: Never Smoker   . Smokeless tobacco: Never Used  . Alcohol Use: No     Comment: rare  . Drug Use: No  . Sexual Activity: Not Asked   Other Topics Concern  . None   Social History Narrative    Outpatient Encounter Prescriptions as of 10/23/2014  Medication Sig  . Multiple Vitamin (MULTIVITAMIN WITH MINERALS) TABS Take 1 tablet by mouth daily.  . Norgestimate-Ethinyl Estradiol Triphasic (ORTHO TRI-CYCLEN, 28,) 0.18/0.215/0.25 MG-35 MCG tablet Take 1 tablet by mouth daily.  . pantoprazole (PROTONIX) 40 MG tablet TAKE 1 TABLET (40 MG TOTAL) BY MOUTH DAILY.  Marland Kitchen triamcinolone (NASACORT) 55 MCG/ACT AERO nasal inhaler Place 2 sprays into the nose daily. (Patient taking differently: Place 2 sprays into the nose daily as  needed. )  . [DISCONTINUED] cetirizine (ZYRTEC) 10 MG tablet Take 10 mg by mouth daily as needed.    No facility-administered encounter medications on file as of 10/23/2014.    Outpatient Encounter Prescriptions as of 10/23/2014  Medication Sig  . Multiple Vitamin (MULTIVITAMIN WITH MINERALS) TABS Take 1 tablet by mouth daily.  . Norgestimate-Ethinyl Estradiol Triphasic (ORTHO TRI-CYCLEN, 28,) 0.18/0.215/0.25 MG-35 MCG tablet Take 1 tablet by mouth daily.  . pantoprazole (PROTONIX) 40 MG tablet TAKE 1 TABLET (40 MG TOTAL) BY MOUTH DAILY.  Marland Kitchen triamcinolone (NASACORT) 55 MCG/ACT AERO nasal inhaler Place 2 sprays into the nose daily. (Patient taking differently: Place 2 sprays into the nose daily as needed. )  . [DISCONTINUED] cetirizine (ZYRTEC) 10 MG tablet Take 10 mg by mouth daily as needed.    No  facility-administered encounter medications on file as of 10/23/2014.    Review of Systems  Constitutional: Negative for appetite change and unexpected weight change.       Is concerned about not being able to lose weight.    HENT: Negative for congestion and sinus pressure.        Using nasacort.    Eyes: Negative for pain and visual disturbance.  Respiratory: Negative for cough, chest tightness and shortness of breath.   Cardiovascular: Negative for chest pain, palpitations and leg swelling.  Gastrointestinal: Negative for nausea, vomiting, abdominal pain and diarrhea.  Genitourinary: Negative for dysuria and difficulty urinating.  Musculoskeletal: Negative for back pain and joint swelling.  Skin: Negative for color change and rash.  Neurological: Positive for dizziness (better now.  ). Negative for headaches.  Hematological: Negative for adenopathy. Does not bruise/bleed easily.  Psychiatric/Behavioral: Negative for dysphoric mood and agitation.       Objective:     Blood pressure rechecked by me:  106/68  Physical Exam  Constitutional: She appears well-developed and well-nourished. No distress.  HENT:  Nose: Nose normal.  Mouth/Throat: Oropharynx is clear and moist.  Eyes: Conjunctivae are normal. Right eye exhibits no discharge. Left eye exhibits no discharge.  Neck: Neck supple. No thyromegaly present.  Cardiovascular: Normal rate and regular rhythm.   Pulmonary/Chest: Breath sounds normal. No respiratory distress. She has no wheezes.  Abdominal: Soft. Bowel sounds are normal. There is no tenderness.  Musculoskeletal: She exhibits no edema or tenderness.  Lymphadenopathy:    She has no cervical adenopathy.  Skin: No rash noted. No erythema.  Psychiatric: She has a normal mood and affect. Her behavior is normal.    BP 117/65 mmHg  Pulse 91  Temp(Src) 98.4 F (36.9 C) (Oral)  Ht 5' 0.25" (1.53 m)  Wt 223 lb 4 oz (101.266 kg)  BMI 43.26 kg/m2  SpO2 99%  LMP  10/18/2014 (Exact Date) Wt Readings from Last 3 Encounters:  10/23/14 223 lb 4 oz (101.266 kg)  08/02/13 197 lb 12 oz (89.699 kg)  01/28/13 197 lb 8 oz (89.585 kg)     Lab Results  Component Value Date   WBC 9.8 10/23/2014   HGB 11.1* 10/23/2014   HCT 34.0* 10/23/2014   PLT 365.0 10/23/2014   GLUCOSE 93 10/23/2014   ALT 10 10/23/2014   AST 13 10/23/2014   NA 141 10/23/2014   K 4.0 10/23/2014   CL 104 10/23/2014   CREATININE 0.76 10/23/2014   BUN 14 10/23/2014   CO2 27 10/23/2014   TSH 0.49 10/23/2014       Assessment & Plan:   Problem List Items Addressed This Visit  Anemia    Recheck cbc today.        Relevant Orders   CBC with Differential/Platelet (Completed)   Ferritin (Completed)   Comprehensive metabolic panel (Completed)   Dizziness - Primary    Is better - after taking cipro.  Will follow.        Environmental allergies    nasacort helps.  Follow.        Frequent UTI    Just completed cipro.  Better now.  Follow.        Hair loss    Recheck cbc and tsh.        Relevant Orders   TSH (Completed)   T3, free (Completed)   T4, free (Completed)   Memory change    Feel is multifactorial.  Not sleeping.  Check routine labs.  Follow.        PCOS (polycystic ovarian syndrome)    Sees gyn.  Just had a baby.  Follow.           Einar Pheasant, MD

## 2014-10-29 ENCOUNTER — Encounter: Payer: Self-pay | Admitting: Internal Medicine

## 2014-10-29 DIAGNOSIS — R413 Other amnesia: Secondary | ICD-10-CM | POA: Insufficient documentation

## 2014-10-29 DIAGNOSIS — L659 Nonscarring hair loss, unspecified: Secondary | ICD-10-CM | POA: Insufficient documentation

## 2014-10-29 DIAGNOSIS — N390 Urinary tract infection, site not specified: Secondary | ICD-10-CM | POA: Insufficient documentation

## 2014-10-29 NOTE — Assessment & Plan Note (Signed)
Sees gyn.  Just had a baby.  Follow.

## 2014-10-29 NOTE — Assessment & Plan Note (Signed)
Feel is multifactorial.  Not sleeping.  Check routine labs.  Follow.

## 2014-10-29 NOTE — Assessment & Plan Note (Signed)
nasacort helps.  Follow.

## 2014-10-29 NOTE — Assessment & Plan Note (Signed)
Recheck cbc today.   

## 2014-10-29 NOTE — Assessment & Plan Note (Signed)
Is better - after taking cipro.  Will follow.

## 2014-10-29 NOTE — Assessment & Plan Note (Signed)
Just completed cipro.  Better now.  Follow.

## 2014-10-29 NOTE — Assessment & Plan Note (Signed)
Recheck cbc and tsh.

## 2014-11-13 ENCOUNTER — Other Ambulatory Visit: Payer: Self-pay | Admitting: Internal Medicine

## 2014-12-10 ENCOUNTER — Emergency Department: Payer: 59

## 2014-12-10 ENCOUNTER — Emergency Department
Admission: EM | Admit: 2014-12-10 | Discharge: 2014-12-10 | Disposition: A | Discharge status: Home / Self Care | Payer: 59 | Attending: Emergency Medicine | Admitting: Emergency Medicine

## 2014-12-10 DIAGNOSIS — R1033 Periumbilical pain: Secondary | ICD-10-CM | POA: Diagnosis not present

## 2014-12-10 DIAGNOSIS — Z3202 Encounter for pregnancy test, result negative: Secondary | ICD-10-CM | POA: Diagnosis not present

## 2014-12-10 DIAGNOSIS — R109 Unspecified abdominal pain: Secondary | ICD-10-CM | POA: Diagnosis present

## 2014-12-10 DIAGNOSIS — Z79899 Other long term (current) drug therapy: Secondary | ICD-10-CM | POA: Insufficient documentation

## 2014-12-10 DIAGNOSIS — R197 Diarrhea, unspecified: Secondary | ICD-10-CM | POA: Diagnosis not present

## 2014-12-10 DIAGNOSIS — Z793 Long term (current) use of hormonal contraceptives: Secondary | ICD-10-CM | POA: Diagnosis not present

## 2014-12-10 DIAGNOSIS — R112 Nausea with vomiting, unspecified: Secondary | ICD-10-CM | POA: Insufficient documentation

## 2014-12-10 DIAGNOSIS — R000 Tachycardia, unspecified: Secondary | ICD-10-CM | POA: Insufficient documentation

## 2014-12-10 LAB — COMPREHENSIVE METABOLIC PANEL
ALK PHOS: 97 U/L (ref 38–126)
ALT: 21 U/L (ref 14–54)
ANION GAP: 5 (ref 5–15)
AST: 26 U/L (ref 15–41)
Albumin: 3.8 g/dL (ref 3.5–5.0)
BILIRUBIN TOTAL: 0.5 mg/dL (ref 0.3–1.2)
BUN: 15 mg/dL (ref 6–20)
CALCIUM: 8.1 mg/dL — AB (ref 8.9–10.3)
CO2: 25 mmol/L (ref 22–32)
Chloride: 105 mmol/L (ref 101–111)
Creatinine, Ser: 0.76 mg/dL (ref 0.44–1.00)
GFR calc non Af Amer: >60 mL/min (ref >60)
Glucose, Bld: 108 mg/dL — ABNORMAL HIGH (ref 65–99)
POTASSIUM: 3.7 mmol/L (ref 3.5–5.1)
SODIUM: 135 mmol/L (ref 135–145)
TOTAL PROTEIN: 7.2 g/dL (ref 6.5–8.1)

## 2014-12-10 LAB — URINALYSIS COMPLETE WITH MICROSCOPIC (ARMC ONLY)
Bilirubin Urine: NEGATIVE
Glucose, UA: NEGATIVE mg/dL
HGB URINE DIPSTICK: NEGATIVE
KETONES UR: NEGATIVE mg/dL
LEUKOCYTES UA: NEGATIVE
NITRITE: NEGATIVE
PH: 5 (ref 5.0–8.0)
PROTEIN: NEGATIVE mg/dL
SPECIFIC GRAVITY, URINE: 1.025 (ref 1.005–1.030)

## 2014-12-10 LAB — CBC
HCT: 39 % (ref 35.0–47.0)
HEMOGLOBIN: 12.8 g/dL (ref 12.0–16.0)
MCH: 25 pg — ABNORMAL LOW (ref 26.0–34.0)
MCHC: 32.7 g/dL (ref 32.0–36.0)
MCV: 76.6 fL — ABNORMAL LOW (ref 80.0–100.0)
Platelets: 308 10*3/uL (ref 150–440)
RBC: 5.09 MIL/uL (ref 3.80–5.20)
RDW: 17.4 % — ABNORMAL HIGH (ref 11.5–14.5)
WBC: 8.1 10*3/uL (ref 3.6–11.0)

## 2014-12-10 LAB — LIPASE, BLOOD: Lipase: 18 U/L — ABNORMAL LOW (ref 22–51)

## 2014-12-10 LAB — PREGNANCY, URINE: Preg Test, Ur: NEGATIVE

## 2014-12-10 MED ORDER — SODIUM CHLORIDE 0.9 % IV SOLN
8.0000 mg | Freq: Once | INTRAVENOUS | Status: AC
  Administered 2014-12-10: 8 mg via INTRAVENOUS
  Filled 2014-12-10: qty 4

## 2014-12-10 MED ORDER — PB-HYOSCY-ATROPINE-SCOPOLAMINE 16.2 MG PO TABS: 1.0000 | ORAL_TABLET | Freq: Three times a day (TID) | ORAL | Status: DC | PRN

## 2014-12-10 MED ORDER — SODIUM CHLORIDE 0.9 % IV BOLUS (SEPSIS)
1000.0000 mL | Freq: Once | INTRAVENOUS | Status: AC
  Administered 2014-12-10: 1000 mL via INTRAVENOUS

## 2014-12-10 MED ORDER — METOCLOPRAMIDE HCL 5 MG/ML IJ SOLN
10.0000 mg | Freq: Once | INTRAMUSCULAR | Status: AC
  Administered 2014-12-10: 10 mg via INTRAVENOUS
  Filled 2014-12-10: qty 2

## 2014-12-10 MED ORDER — PROMETHAZINE HCL 25 MG RE SUPP: 25.0000 mg | Freq: Four times a day (QID) | RECTAL | Status: DC | PRN

## 2014-12-10 MED ORDER — METOCLOPRAMIDE HCL 5 MG PO TABS: 5.0000 mg | ORAL_TABLET | Freq: Three times a day (TID) | ORAL | Status: DC

## 2014-12-10 MED ORDER — IOHEXOL 240 MG/ML SOLN
25.0000 mL | Freq: Once | INTRAMUSCULAR | Status: AC | PRN
  Administered 2014-12-10: 25 mL via ORAL

## 2014-12-10 MED ORDER — PB-HYOSCY-ATROPINE-SCOPOLAMINE 16.2 MG/5ML PO ELIX
10.0000 mL | ORAL_SOLUTION | Freq: Once | ORAL | Status: AC
  Administered 2014-12-10: 32.4 mg via ORAL
  Filled 2014-12-10: qty 10

## 2014-12-10 MED ORDER — HYDROMORPHONE HCL 1 MG/ML IJ SOLN
0.5000 mg | INTRAMUSCULAR | Status: DC | PRN
  Administered 2014-12-10: 0.5 mg via INTRAVENOUS
  Filled 2014-12-10: qty 1

## 2014-12-10 MED ORDER — IOHEXOL 300 MG/ML  SOLN
125.0000 mL | Freq: Once | INTRAMUSCULAR | Status: AC | PRN
  Administered 2014-12-10: 14:00:00 via INTRAVENOUS

## 2014-12-10 NOTE — ED Provider Notes (Signed)
[facility] Emergency Department Provider Note  ____________________________________________  Time seen: 12:10 PM  I have reviewed the triage vital signs and the nursing notes.   HISTORY  Chief Complaint Abdominal Pain     HPI Martha Miranda is a 34 y.o. 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 She presents to the emergency department with pain in her abdomen 3-4 days. It is primarily around the umbilicus. She also complains of nausea. The pain waxes and wanes and when it gets significantly worse, the patient has emesis. She reports this is happened 10-15 times over the past 2-3 days. She denies any diarrhea. She woke last night with a fever.  The patient had her gallbladder removed in the past. She had a C-section approximately 6 months ago.     Past Medical History  Diagnosis Date  . Allergy   . GERD (gastroesophageal reflux disease)   . Chicken pox   . Depression   . Hypoglycemia   . Urinary incontinence   . UTI (urinary tract infection)   . Hydronephrosis   . Polycystic ovarian syndrome   . Genital warts     Patient Active Problem List   Diagnosis Date Noted  . Frequent UTI 10/29/2014  . Hair loss 10/29/2014  . Memory change 10/29/2014  . Dizziness 10/23/2014  . Anemia 10/23/2014  . Nephrolithiasis 08/07/2013  . Dermoid tumor 08/07/2013  . Breast nodule 01/30/2013  . Depression 06/27/2012  . Hypoglycemia 06/27/2012  . GERD (gastroesophageal reflux disease) 06/27/2012  . Environmental allergies 06/27/2012  . PCOS (polycystic ovarian syndrome) 06/27/2012  . Hydronephrosis 06/27/2012    Past Surgical History  Procedure Laterality Date  . Utetral stent placement w/ removal    . 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 Tonsillectomy and adenoidectomy  1988  . Cholecystectomy  2011  . Partial oophorectomy      removed 1/2 of right ovary  . Right oophorectomy  05/19/2013    & bitaleral tubal  removal    Current Outpatient Rx  Name  Route  Sig  Dispense  Refill  . ferrous sulfate 325 (65 FE) MG tablet   Oral   Take 325 mg by mouth daily.         . Multiple Vitamin (MULTIVITAMIN WITH MINERALS) TABS   Oral   Take 1 tablet by mouth daily.         . Norgestimate-Ethinyl Estradiol Triphasic (ORTHO TRI-CYCLEN, 28,) 0.18/0.215/0.25 MG-35 MCG tablet   Oral   Take 1 tablet by mouth daily.         . pantoprazole (PROTONIX) 40 MG tablet      TAKE 1 TABLET (40 MG TOTAL) BY MOUTH DAILY.   30 tablet   11   . triamcinolone (NASACORT) 55 MCG/ACT AERO nasal inhaler   Nasal   Place 2 sprays into the nose daily. Patient taking differently: Place 2 sprays into the nose daily as needed.    1 Inhaler   5   . belladona alk-PHENObarbital (DONNATAL) 16.2 MG tablet   Oral   Take 1 tablet by mouth every 8 (eight) hours as needed.   15 tablet   0   . metoCLOPramide (REGLAN) 5 MG tablet   Oral   Take 1 tablet (5 mg total) by mouth 3 (three) times daily.   15 tablet   0   . promethazine (PHENERGAN) 25 MG suppository   Rectal   Place 1 suppository (  25 mg total) rectally every 6 (six) hours as needed for nausea.   4 suppository   1     Allergies Percocet and Chlorhexidine  Family History  Problem Relation Age of Onset  . Arthritis Mother   . Mental illness Mother   . Stroke Father   . High blood pressure Father   . Mental illness Father   . Diabetes Father   . Breast cancer Maternal Aunt   . Arthritis Maternal Grandmother   . High blood pressure Maternal Grandmother   . Diabetes Maternal Grandmother   . Dementia Maternal Grandmother   . High blood pressure Maternal Grandfather   . Diabetes Maternal Grandfather   . Arthritis Paternal Grandmother   . High blood pressure Paternal Grandmother   . Kidney disease Paternal Grandmother   . Diabetes Paternal Grandmother   . Mental illness Paternal Grandfather     Social History Social History  Substance Use Topics   . Smoking status: Never Smoker   . Smokeless tobacco: Never Used  . Alcohol Use: No     Comment: rare    Review of Systems  Constitutional: Negative for fever. ENT: Negative for sore throat. Cardiovascular: Negative for chest pain. Respiratory: Negative for cough. Gastrointestinal: Positive for abdominal pain with vomiting. No diarrhea. See history of present illness. Genitourinary: Negative for dysuria. Musculoskeletal: No myalgias or injuries. Skin: Negative for rash. Neurological: Negative for paresthesia or weakness   10-point ROS otherwise negative.  ____________________________________________   PHYSICAL EXAM:  VITAL SIGNS: ED Triage Vitals  Enc Vitals Group     BP 12/10/14 1104 114/70 mmHg     Pulse Rate 12/10/14 1104 120     Resp 12/10/14 1104 20     Temp 12/10/14 1104 98.2 F (36.8 C)     Temp Source 12/10/14 1104 Oral     SpO2 12/10/14 1104 97 %     Weight 12/10/14 1104 220 lb (99.791 kg)     Height 12/10/14 1104 5' (1.524 m)     Head Cir --      Peak Flow --      Pain Score 12/10/14 1104 7     Pain Loc --      Pain Edu? --      Excl. in [place]? --     Constitutional:  Alert and oriented. Appears uncomfortable which worsens during abdominal examination to the point of moderate distress. ENT   Head: Normocephalic and atraumatic.   Nose: No congestion/rhinnorhea.    Cardiovascular: Tachycardia at 105, regular rhythm, no murmur noted Respiratory:  Normal respiratory effort, no tachypnea.    Breath sounds are clear and equal bilaterally.  Gastrointestinal: Soft. No distention. Notable tenderness is above the umbilicus extending up towards the epigastric area, but does not appear to be directly over the pancreas itself. Back: No muscle spasm, no tenderness, no CVA tenderness. Musculoskeletal: No deformity noted. Nontender with normal range of motion in all extremities.  No noted edema. Neurologic:  Normal speech and language. No gross focal neurologic  deficits are appreciated.  Skin:  Skin is warm, dry. No rash noted. Psychiatric: Mood and affect are normal. Speech and behavior are normal.  ____________________________________________    LABS (pertinent positives/negatives)  Labs Reviewed  LIPASE, BLOOD - Abnormal; Notable for the following:    Lipase 18 (*)    All other components within normal limits  COMPREHENSIVE METABOLIC PANEL - Abnormal; Notable for the following:    Glucose, Bld 108 (*)    Calcium  8.1 (*)    All other components within normal limits  CBC - Abnormal; Notable for the following:    MCV 76.6 (*)    MCH 25.0 (*)    RDW 17.4 (*)    All other components within normal limits  URINALYSIS COMPLETEWITH MICROSCOPIC ([facility] ONLY) - Abnormal; Notable for the following:    Color, Urine YELLOW (*)    APPearance CLEAR (*)    Bacteria, UA FEW (*)    Squamous Epithelial / LPF 6-30 (*)    All other components within normal limits  PREGNANCY, URINE  POC URINE PREG, ED     ____________________________________________    RADIOLOGY  CT abdomen and pelvis: IMPRESSION: 1. No acute findings. No findings to account for the patient's reported symptoms. 2. Status post cholecystectomy. 3. Left ovary mildly prominent due to an apparent 3.9 cm cyst, likely physiologic.  ____________________________________________   INITIAL IMPRESSION / ASSESSMENT AND PLAN / ED COURSE  Pertinent labs & imaging results that were available during my care of the patient were reviewed by me and considered in my medical decision making (see chart for details).  Worrisome condition with significant abdominal pain in an otherwise healthy 34 year old female. We will treat her pain with small amounts of Dilaudid. She reports that Dilaudid and morphine and other narcotics do make her nauseous. We'll be treating her nausea anyway with Reglan. We will give her 1 L of normal saline and obtain a CT scan. This is discussed with the patient during the  evaluation and she agreed with the CT.  ----------------------------------------- 2:43 PM on 12/10/2014 -----------------------------------------  CT scan does not show any acute findings. Reexamination of the patient this time reports that her pain has decreased to a 3 or 4, however she still looks a little uncomfortable. She is received 1 L of normal saline IV and she drank 2 bottles of contrast, yet she still little tachycardic at 104. She reports ongoing nausea currently. We'll add Zofran to the current treatment and add Donnatal as well.  ----------------------------------------- 3:58 PM on 12/10/2014 -----------------------------------------   The patient's time feels significantly more comfortable. She feels a Donnatal has helped. She is also gotten up and gone to the toilet and has a large watery bowel movement. She denies any blood or dark stool present with that bowel movement. Her heart rate has decreased to 95.  We will discharge the patient home since, after she receives a little bit more IV fluids, with a prescription for Donnatal, Reglan, and Phenergan suppositories when necessary.    ____________________________________________   FINAL CLINICAL IMPRESSION(S) / ED DIAGNOSES  Final diagnoses:  Periumbilical abdominal pain  Diarrhea, unspecified type      Ahmed Prima, MD 12/10/14 1616

## 2014-12-10 NOTE — Discharge Instructions (Signed)
Your CT scan did not show any acute findings. You likely have a form of intestinal infection with the diarrhea/loose stool you had in the emergency department. Continue Reglan and Donnatal as needed. If this is not effective, you may use the Phenergan suppositories to get nausea under control. Follow-up with regular doctor. Return to the emergency department if you have further abdominal pain or other urgent concerns.  Abdominal Pain, Adult Many things can cause abdominal pain. Usually, abdominal pain is not caused by a disease and will improve without treatment. It can often be observed and treated at home. Your health care provider will do a physical exam and possibly order blood tests and X-rays to help determine the seriousness of your pain. However, in many cases, more time must pass before a clear cause of the pain can be found. Before that point, your health care provider may not know if you need more testing or further treatment. HOME CARE INSTRUCTIONS Monitor your abdominal pain for any changes. The following actions may help to alleviate any discomfort you are experiencing:  Only take over-the-counter or prescription medicines as directed by your health care provider.  Do not take laxatives unless directed to do so by your health care provider.  Try a clear liquid diet (broth, tea, or water) as directed by your health care provider. Slowly move to a bland diet as tolerated. SEEK MEDICAL CARE IF:  You have unexplained abdominal pain.  You have abdominal pain associated with nausea or diarrhea.  You have pain when you urinate or have a bowel movement.  You experience abdominal pain that wakes you in the night.  You have abdominal pain that is worsened or improved by eating food.  You have abdominal pain that is worsened with eating fatty foods.  You have a fever. SEEK IMMEDIATE MEDICAL CARE IF:  Your pain does not go away within 2 hours.  You keep throwing up  (vomiting).  Your pain is felt only in portions of the abdomen, such as the right side or the left lower portion of the abdomen.  You pass bloody or black tarry stools. MAKE SURE YOU:  Understand these instructions.  Will watch your condition.  Will get help right away if you are not doing well or get worse.   This information is not intended to replace advice given to you by your health care provider. Make sure you discuss any questions you have with your health care provider.   Document Released: 11/20/2004 Document Revised: 11/01/2014 Document Reviewed: 10/20/2012 Elsevier Interactive Patient Education [employer].

## 2014-12-10 NOTE — ED Notes (Signed)
Pt c/o umbilical pain for the past 4 days, since last night having severe N/V with fever.Marland Kitchen

## 2014-12-12 ENCOUNTER — Observation Stay
Admission: EM | Admit: 2014-12-12 | Discharge: 2014-12-14 | Disposition: A | Discharge status: Home / Self Care | Payer: 59 | Attending: Surgery | Admitting: Surgery

## 2014-12-12 ENCOUNTER — Emergency Department: Payer: 59

## 2014-12-12 ENCOUNTER — Encounter: Payer: Self-pay | Admitting: Internal Medicine

## 2014-12-12 ENCOUNTER — Ambulatory Visit (INDEPENDENT_AMBULATORY_CARE_PROVIDER_SITE_OTHER): Payer: 59 | Admitting: Internal Medicine

## 2014-12-12 ENCOUNTER — Encounter: Payer: Self-pay | Admitting: *Deleted

## 2014-12-12 VITALS — BP 100/60 | HR 83 | Temp 98.7°F | Resp 18 | Ht 60.25 in | Wt 217.0 lb

## 2014-12-12 DIAGNOSIS — Z803 Family history of malignant neoplasm of breast: Secondary | ICD-10-CM | POA: Insufficient documentation

## 2014-12-12 DIAGNOSIS — R1084 Generalized abdominal pain: Secondary | ICD-10-CM | POA: Diagnosis not present

## 2014-12-12 DIAGNOSIS — Z79899 Other long term (current) drug therapy: Secondary | ICD-10-CM | POA: Insufficient documentation

## 2014-12-12 DIAGNOSIS — F329 Major depressive disorder, single episode, unspecified: Secondary | ICD-10-CM | POA: Diagnosis not present

## 2014-12-12 DIAGNOSIS — E162 Hypoglycemia, unspecified: Secondary | ICD-10-CM | POA: Insufficient documentation

## 2014-12-12 DIAGNOSIS — N200 Calculus of kidney: Secondary | ICD-10-CM | POA: Insufficient documentation

## 2014-12-12 DIAGNOSIS — Z888 Allergy status to other drugs, medicaments and biological substances status: Secondary | ICD-10-CM | POA: Diagnosis not present

## 2014-12-12 DIAGNOSIS — Z90722 Acquired absence of ovaries, bilateral: Secondary | ICD-10-CM | POA: Diagnosis not present

## 2014-12-12 DIAGNOSIS — R1033 Periumbilical pain: Secondary | ICD-10-CM | POA: Insufficient documentation

## 2014-12-12 DIAGNOSIS — Z9049 Acquired absence of other specified parts of digestive tract: Secondary | ICD-10-CM | POA: Diagnosis not present

## 2014-12-12 DIAGNOSIS — N63 Unspecified lump in breast: Secondary | ICD-10-CM | POA: Insufficient documentation

## 2014-12-12 DIAGNOSIS — R42 Dizziness and giddiness: Secondary | ICD-10-CM | POA: Diagnosis not present

## 2014-12-12 DIAGNOSIS — Z823 Family history of stroke: Secondary | ICD-10-CM | POA: Insufficient documentation

## 2014-12-12 DIAGNOSIS — E282 Polycystic ovarian syndrome: Secondary | ICD-10-CM | POA: Diagnosis not present

## 2014-12-12 DIAGNOSIS — Z8249 Family history of ischemic heart disease and other diseases of the circulatory system: Secondary | ICD-10-CM | POA: Insufficient documentation

## 2014-12-12 DIAGNOSIS — Z841 Family history of disorders of kidney and ureter: Secondary | ICD-10-CM | POA: Diagnosis not present

## 2014-12-12 DIAGNOSIS — R109 Unspecified abdominal pain: Secondary | ICD-10-CM | POA: Insufficient documentation

## 2014-12-12 DIAGNOSIS — K529 Noninfective gastroenteritis and colitis, unspecified: Secondary | ICD-10-CM | POA: Diagnosis not present

## 2014-12-12 DIAGNOSIS — Z818 Family history of other mental and behavioral disorders: Secondary | ICD-10-CM | POA: Insufficient documentation

## 2014-12-12 DIAGNOSIS — Z833 Family history of diabetes mellitus: Secondary | ICD-10-CM | POA: Diagnosis not present

## 2014-12-12 DIAGNOSIS — Z8261 Family history of arthritis: Secondary | ICD-10-CM | POA: Diagnosis not present

## 2014-12-12 DIAGNOSIS — D649 Anemia, unspecified: Secondary | ICD-10-CM | POA: Insufficient documentation

## 2014-12-12 DIAGNOSIS — K2100 Gastro-esophageal reflux disease with esophagitis, without bleeding: Secondary | ICD-10-CM

## 2014-12-12 DIAGNOSIS — N390 Urinary tract infection, site not specified: Secondary | ICD-10-CM | POA: Diagnosis not present

## 2014-12-12 DIAGNOSIS — K219 Gastro-esophageal reflux disease without esophagitis: Secondary | ICD-10-CM | POA: Diagnosis not present

## 2014-12-12 DIAGNOSIS — Z87440 Personal history of urinary (tract) infections: Secondary | ICD-10-CM | POA: Diagnosis not present

## 2014-12-12 DIAGNOSIS — R1031 Right lower quadrant pain: Secondary | ICD-10-CM | POA: Insufficient documentation

## 2014-12-12 DIAGNOSIS — Z885 Allergy status to narcotic agent status: Secondary | ICD-10-CM | POA: Insufficient documentation

## 2014-12-12 DIAGNOSIS — R413 Other amnesia: Secondary | ICD-10-CM | POA: Insufficient documentation

## 2014-12-12 LAB — CBC WITH DIFFERENTIAL/PLATELET
Basophils Absolute: 0 K/uL (ref 0–0.1)
Basophils Relative: 1 %
Eosinophils Absolute: 0.4 K/uL (ref 0–0.7)
Eosinophils Relative: 6 %
HCT: 37.7 % (ref 35.0–47.0)
Hemoglobin: 12.3 g/dL (ref 12.0–16.0)
Lymphocytes Relative: 20 %
Lymphs Abs: 1.3 K/uL (ref 1.0–3.6)
MCH: 25.3 pg — ABNORMAL LOW (ref 26.0–34.0)
MCHC: 32.6 g/dL (ref 32.0–36.0)
MCV: 77.5 fL — ABNORMAL LOW (ref 80.0–100.0)
Monocytes Absolute: 0.6 K/uL (ref 0.2–0.9)
Monocytes Relative: 9 %
Neutro Abs: 4.3 K/uL (ref 1.4–6.5)
Neutrophils Relative %: 64 %
Platelets: 233 K/uL (ref 150–440)
RBC: 4.86 MIL/uL (ref 3.80–5.20)
RDW: 17.1 % — ABNORMAL HIGH (ref 11.5–14.5)
WBC: 6.6 K/uL (ref 3.6–11.0)

## 2014-12-12 LAB — URINALYSIS COMPLETE WITH MICROSCOPIC (ARMC ONLY)
Bilirubin Urine: NEGATIVE
Glucose, UA: NEGATIVE mg/dL
Ketones, ur: NEGATIVE mg/dL
NITRITE: NEGATIVE
PH: 6 (ref 5.0–8.0)
PROTEIN: 100 mg/dL — AB
SPECIFIC GRAVITY, URINE: 1.018 (ref 1.005–1.030)

## 2014-12-12 LAB — COMPREHENSIVE METABOLIC PANEL
ALBUMIN: 3.8 g/dL (ref 3.5–5.0)
ALT: 169 U/L — ABNORMAL HIGH (ref 14–54)
ANION GAP: 11 (ref 5–15)
AST: 124 U/L — ABNORMAL HIGH (ref 15–41)
Alkaline Phosphatase: 83 U/L (ref 38–126)
BUN: 7 mg/dL (ref 6–20)
CHLORIDE: 105 mmol/L (ref 101–111)
CO2: 23 mmol/L (ref 22–32)
Calcium: 8.6 mg/dL — ABNORMAL LOW (ref 8.9–10.3)
Creatinine, Ser: 0.71 mg/dL (ref 0.44–1.00)
GFR calc non Af Amer: >60 mL/min (ref >60)
GLUCOSE: 89 mg/dL (ref 65–99)
POTASSIUM: 3.4 mmol/L — AB (ref 3.5–5.1)
SODIUM: 139 mmol/L (ref 135–145)
TOTAL PROTEIN: 7.1 g/dL (ref 6.5–8.1)
Total Bilirubin: 0.3 mg/dL (ref 0.3–1.2)

## 2014-12-12 LAB — LIPASE, BLOOD: Lipase: 18 U/L — ABNORMAL LOW (ref 22–51)

## 2014-12-12 LAB — HCG, QUANTITATIVE, PREGNANCY: hCG, Beta Chain, Quant, S: <1 m[IU]/mL

## 2014-12-12 MED ORDER — ONDANSETRON HCL 4 MG/2ML IJ SOLN
INTRAMUSCULAR | Status: AC
Start: 2014-12-12 — End: 2014-12-12
  Administered 2014-12-12: 4 mg via INTRAVENOUS
  Filled 2014-12-12: qty 2

## 2014-12-12 MED ORDER — HYDROMORPHONE HCL 1 MG/ML IJ SOLN: 0.5000 mg | INTRAMUSCULAR | Status: DC | PRN

## 2014-12-12 MED ORDER — NORGESTIM-ETH ESTRAD TRIPHASIC 0.18/0.215/0.25 MG-35 MCG PO TABS: 1.0000 | ORAL_TABLET | Freq: Every day | ORAL | Status: DC

## 2014-12-12 MED ORDER — ACETAMINOPHEN 325 MG PO TABS
650.0000 mg | ORAL_TABLET | Freq: Four times a day (QID) | ORAL | Status: DC | PRN
  Administered 2014-12-13: 650 mg via ORAL
  Filled 2014-12-12: qty 2

## 2014-12-12 MED ORDER — HEPARIN SODIUM (PORCINE) 5000 UNIT/ML IJ SOLN
5000.0000 [IU] | Freq: Three times a day (TID) | INTRAMUSCULAR | Status: DC
  Filled 2014-12-12 (×2): qty 1

## 2014-12-12 MED ORDER — ACETAMINOPHEN 650 MG RE SUPP: 650.0000 mg | Freq: Four times a day (QID) | RECTAL | Status: DC | PRN

## 2014-12-12 MED ORDER — LACTATED RINGERS IV SOLN
INTRAVENOUS | Status: DC
  Administered 2014-12-12: 21:00:00 via INTRAVENOUS

## 2014-12-12 MED ORDER — ONDANSETRON HCL 4 MG/2ML IJ SOLN
4.0000 mg | Freq: Four times a day (QID) | INTRAMUSCULAR | Status: DC | PRN
  Administered 2014-12-13: 4 mg via INTRAVENOUS
  Filled 2014-12-12: qty 2

## 2014-12-12 MED ORDER — ONDANSETRON HCL 4 MG/2ML IJ SOLN
4.0000 mg | Freq: Once | INTRAMUSCULAR | Status: AC
  Administered 2014-12-12: 4 mg via INTRAVENOUS

## 2014-12-12 MED ORDER — ONDANSETRON HCL 4 MG/2ML IJ SOLN
4.0000 mg | Freq: Once | INTRAMUSCULAR | Status: AC
  Administered 2014-12-12: 4 mg via INTRAVENOUS
  Filled 2014-12-12: qty 2

## 2014-12-12 MED ORDER — IOHEXOL 300 MG/ML  SOLN
100.0000 mL | Freq: Once | INTRAMUSCULAR | Status: AC | PRN
  Administered 2014-12-12: 100 mL via INTRAVENOUS

## 2014-12-12 MED ORDER — ONDANSETRON 4 MG PO TBDP: 4.0000 mg | ORAL_TABLET | Freq: Four times a day (QID) | ORAL | Status: DC | PRN

## 2014-12-12 MED ORDER — IOHEXOL 240 MG/ML SOLN
25.0000 mL | INTRAMUSCULAR | Status: AC
  Administered 2014-12-12: 25 mL via ORAL

## 2014-12-12 MED ORDER — PANTOPRAZOLE SODIUM 40 MG IV SOLR
40.0000 mg | Freq: Every day | INTRAVENOUS | Status: DC
  Administered 2014-12-13 (×2): 40 mg via INTRAVENOUS
  Filled 2014-12-12 (×2): qty 40

## 2014-12-12 MED ORDER — SODIUM CHLORIDE 0.9 % IV BOLUS (SEPSIS)
1000.0000 mL | Freq: Once | INTRAVENOUS | Status: AC
  Administered 2014-12-12: 1000 mL via INTRAVENOUS

## 2014-12-12 NOTE — Assessment & Plan Note (Signed)
Persistent increased abdominal pain.  Not eating.  Having diarrhea now.  Question if related to reglan.  Persistent nausea.  Mucus membranes dry.  Weak and dizzy.  Feel needs IV hydration.  Also stop reglan, imodium and narcotic pain medication.  See what her body does without these medications.  Feel need repeat xray to confirm no increased bowel distention.  Question of need for repeat CT.  Pt agrees to ER evaluation.  ER notified.

## 2014-12-12 NOTE — ED Notes (Signed)
Midline abdominal pain that radiates to the right side, associated with nausea and vomiting since Thursday. Fever today 100.8- took tylenol at 1:30. Pt was seen here for the same over the weekend, reports no relief.

## 2014-12-12 NOTE — Progress Notes (Signed)
Pre-visit discussion using our clinic review tool. No additional management support is needed unless otherwise documented below in the visit note.  

## 2014-12-12 NOTE — ED Notes (Signed)
CT notified pt has finished PO contrast.

## 2014-12-12 NOTE — ED Provider Notes (Signed)
[facility] Emergency Department Provider Note  ____________________________________________  Time seen: 4:40 PM  I have reviewed the triage vital signs and the nursing notes.   HISTORY  Chief Complaint Abdominal Pain    HPI Martha Miranda is a 34 y.o. female who presents to the ED with right lower quadrant abdominal pain. She first started having pain which was gradual in onset about a week ago it was periumbilical at that time associated with nausea and occasional vomiting. She was seen in the emergency department 2 days ago where labs and CT of the abdomen and pelvis were unremarkable. She was given Reglan for nausea which has been effective, but since starting that has had episodes of diarrhea after taking the Reglan. She's also had a fever for the past 3 days with a MAXIMUM TEMPERATURE today of 100.8. She took Tylenol about 2 hours prior to arrival in the emergency department today. She continues to have nausea. Over the last 2 days the periumbilical pain has migrated to her right lower quadrant and is severe in intensity. Nonradiating. Worse with movement, no alleviating factors. Never had anything like this before. She is status post right oophorectomy for dermoid cysts, but does still have her appendix.     Past Medical History  Diagnosis Date  . Allergy   . GERD (gastroesophageal reflux disease)   . Chicken pox   . Depression   . Hypoglycemia   . Urinary incontinence   . UTI (urinary tract infection)   . Hydronephrosis   . Polycystic ovarian syndrome   . Genital warts      Patient Active Problem List   Diagnosis Date Noted  . Abdominal pain 12/12/2014  . Enteritis 12/12/2014  . Frequent UTI 10/29/2014  . Hair loss 10/29/2014  . Memory change 10/29/2014  . Dizziness 10/23/2014  . Anemia 10/23/2014  . Nephrolithiasis 08/07/2013  . Dermoid tumor 08/07/2013  . Breast nodule 01/30/2013  . Depression 06/27/2012  . Hypoglycemia 06/27/2012  .  GERD (gastroesophageal reflux disease) 06/27/2012  . Environmental allergies 06/27/2012  . PCOS (polycystic ovarian syndrome) 06/27/2012  . Hydronephrosis 06/27/2012     Past Surgical History  Procedure Laterality Date  . Utetral stent placement w/ removal    . 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 Tonsillectomy and adenoidectomy  1988  . Cholecystectomy  2011  . Partial oophorectomy      removed 1/2 of right ovary  . Right oophorectomy  05/19/2013    & bitaleral tubal removal     Current Outpatient Rx  Name  Route  Sig  Dispense  Refill  . ferrous sulfate 325 (65 FE) MG tablet   Oral   Take 325 mg by mouth daily.         . Multiple Vitamin (MULTIVITAMIN WITH MINERALS) TABS   Oral   Take 1 tablet by mouth daily.         . Norgestimate-Ethinyl Estradiol Triphasic (ORTHO TRI-CYCLEN, 28,) 0.18/0.215/0.25 MG-35 MCG tablet   Oral   Take 1 tablet by mouth daily.         . pantoprazole (PROTONIX) 40 MG tablet   Oral   Take 40 mg by mouth daily.         Marland Kitchen triamcinolone (NASACORT) 55 MCG/ACT AERO nasal inhaler   Nasal   Place 2 sprays into the nose daily. Patient taking differently: Place 2 sprays into the nose daily as needed (for allergies).    1 Inhaler  5   . metoCLOPramide (REGLAN) 5 MG tablet   Oral   Take 1 tablet (5 mg total) by mouth 3 (three) times daily. Patient not taking: Reported on 12/12/2014   15 tablet   0      Allergies Percocet and Chlorhexidine   Family History  Problem Relation Age of Onset  . Arthritis Mother   . Mental illness Mother   . Stroke Father   . High blood pressure Father   . Mental illness Father   . Diabetes Father   . Breast cancer Maternal Aunt   . Arthritis Maternal Grandmother   . High blood pressure Maternal Grandmother   . Diabetes Maternal Grandmother   . Dementia Maternal Grandmother   . High blood pressure Maternal Grandfather   . Diabetes Maternal Grandfather   . Arthritis Paternal  Grandmother   . High blood pressure Paternal Grandmother   . Kidney disease Paternal Grandmother   . Diabetes Paternal Grandmother   . Mental illness Paternal Grandfather     Social History Social History  Substance Use Topics  . Smoking status: Never Smoker   . Smokeless tobacco: Never Used  . Alcohol Use: No     Comment: rare    Review of Systems  Constitutional:  Positive fever and chills. No weight changes Eyes:   No blurry vision or double vision.  ENT:   No sore throat. Cardiovascular:   No chest pain. Respiratory:   No dyspnea or cough. Gastrointestinal:   Positive right lower quadrant abdominal pain with occasional vomiting and diarrhea.  No BRBPR or melena. Genitourinary:   Negative for dysuria, urinary retention, bloody urine, or difficulty urinating. Urine is dark Musculoskeletal:   Negative for back pain. No joint swelling or pain. Skin:   Negative for rash. Neurological:   Negative for headaches, focal weakness or numbness. Psychiatric:  No anxiety or depression.   Endocrine:  No hot/cold intolerance, changes in energy, or sleep difficulty.  10-point ROS otherwise negative.  ____________________________________________   PHYSICAL EXAM:  VITAL SIGNS: ED Triage Vitals  Enc Vitals Group     BP 12/12/14 1601 116/40 mmHg     Pulse Rate 12/12/14 1601 103     Resp 12/12/14 1601 18     Temp 12/12/14 1601 98.8 F (37.1 C)     Temp src --      SpO2 12/12/14 1601 100 %     Weight 12/12/14 1601 217 lb (98.431 kg)     Height 12/12/14 1601 5' (1.524 m)     Head Cir --      Peak Flow --      Pain Score 12/12/14 1602 5     Pain Loc --      Pain Edu? --      Excl. in [place]? --      Constitutional:   Alert and oriented. Well appearing and in mild distress due to pain Eyes:   No scleral icterus. No conjunctival pallor. PERRL. EOMI ENT   Head:   Normocephalic and atraumatic.   Nose:   No congestion/rhinnorhea. No septal hematoma   Mouth/Throat:   MMM,  no pharyngeal erythema. No peritonsillar mass. No uvula shift.   Neck:   No stridor. No SubQ emphysema. No meningismus. Hematological/Lymphatic/Immunilogical:   No cervical lymphadenopathy. Cardiovascular:   RRR. Normal and symmetric distal pulses are present in all extremities. No murmurs, rubs, or gallops. Respiratory:   Normal respiratory effort without tachypnea nor retractions. Breath sounds are clear and equal  bilaterally. No wheezes/rales/rhonchi. Gastrointestinal:   Soft with significant right lower quadrant tenderness and positive rebound. There is a positive Rovsing and obturator and psoas sign. She also has pain in the right lower quadrant on forceful percussion of the right heel. No distention. There is no CVA tenderness.  No , rigidity, or guarding. Genitourinary:   deferred Musculoskeletal:   Nontender with normal range of motion in all extremities. No joint effusions.  No lower extremity tenderness.  No edema. Neurologic:   Normal speech and language.  CN 2-10 normal. Motor grossly intact. No pronator drift.  Normal gait. No gross focal neurologic deficits are appreciated.  Skin:    Skin is warm, dry and intact. No rash noted.  No petechiae, purpura, or bullae. Psychiatric:   Mood and affect are normal. Speech and behavior are normal. Patient exhibits appropriate insight and judgment.  ____________________________________________    LABS (pertinent positives/negatives) (all labs ordered are listed, but only abnormal results are displayed) Labs Reviewed  COMPREHENSIVE METABOLIC PANEL - Abnormal; Notable for the following:    Potassium 3.4 (*)    Calcium 8.6 (*)    AST 124 (*)    ALT 169 (*)    All other components within normal limits  LIPASE, BLOOD - Abnormal; Notable for the following:    Lipase 18 (*)    All other components within normal limits  CBC WITH DIFFERENTIAL/PLATELET - Abnormal; Notable for the following:    MCV 77.5 (*)    MCH 25.3 (*)    RDW 17.1 (*)     All other components within normal limits  URINALYSIS COMPLETEWITH MICROSCOPIC ([facility] ONLY) - Abnormal; Notable for the following:    Color, Urine YELLOW (*)    APPearance CLEAR (*)    Hgb urine dipstick 1+ (*)    Protein, ur 100 (*)    Leukocytes, UA TRACE (*)    Bacteria, UA FEW (*)    Squamous Epithelial / LPF 0-5 (*)    All other components within normal limits  HCG, QUANTITATIVE, PREGNANCY  COMPREHENSIVE METABOLIC PANEL  CBC   ____________________________________________   EKG    ____________________________________________    RADIOLOGY  CT abdomen and pelvis reveals a normal appendix and multiple enlarged lymph nodes in the mesentery.  ____________________________________________   PROCEDURES   ____________________________________________   INITIAL IMPRESSION / ASSESSMENT AND PLAN / ED COURSE  Pertinent labs & imaging results that were available during my care of the patient were reviewed by me and considered in my medical decision making (see chart for details).  Patient presents with right lower quadrant pain fever and other GI symptoms which is highly concerning for appendicitis. Despite her recent negative workup 2 days and ago in the emergency department, her continued evolution of symptoms is highly concerning and suggest that 2 days ago when she was scanned it was early appendicitis that was not yet mounting inflammatory changes to be radiographically identifiable. We will recheck labs, give her IV fluids and anti-medics, and repeat the CT scan. We'll do this with oral contrast as well for maximum diagnostic affect.  ----------------------------------------- 7:08 PM on 12/12/2014 -----------------------------------------  Remains hemodynamically stable. The on-call general surgeon from the daytime did stop by and evaluate the patient and review the CT scan from 2 days ago and agrees that there is no clear evidence of appendicitis on that scan. She  agrees with repeat CT today for further delineation of the inflammatory process causing all these symptoms and findings.  ----------------------------------------- 8:50 PM on  12/12/2014 -----------------------------------------  Results reviewed inflamed lymph nodes in the abdomen as well as mild elevation of the transaminases. He is discussed with surgery Dr. Rexene Edison who plans to hospitalize for symptom control and serial exams and trending of LFTs. Abdominal pain and diarrhea is consistent with enteritis   ____________________________________________   FINAL CLINICAL IMPRESSION(S) / ED DIAGNOSES  Final diagnoses:  RLQ abdominal pain   enteritis    Carrie Mew, MD 12/12/14 2051

## 2014-12-12 NOTE — H&P (Signed)
CC: RLQ pain x 4 days, nausea/vomiting, diarrhea  HPI: [name] is a pleasant 34 yo F with a history of cholecystectomy and prior right oophorectomy and right renal stent placements who presents with 4 days of periumbilical, now RLQ pain, nausea/vomiting.  Says that her pain began acutely around umbilicus.  Was associated with nausea/vomiting, intractable, unimproved with oral zofran.  Also with fevers to 103, improved with ibuprofen.  Presented to ER 2 days ago and underwent CT which was largely unremarkable, received diagnosis of enteritis and d/c on reglan for nausea.  Since then pain has gone to RLQ and has had diarrhea in addition to above symptoms.  Fevers improved.  Has gotten IVF in ER and tachycardia has improved.  Nausea improved with IV zofran, able to tolerate oral contrast.  AST/ALT mildly elevated, WBC remains normal, CT shows mildly increased mesenteric lymphadenopathy.  Otherwise no sick contacts, no unusual ingestions.  No chest pain, shortness of breath, cough, dysuria/hematuria.  Active Ambulatory Problems    Diagnosis Date Noted  . Depression 06/27/2012  . Hypoglycemia 06/27/2012  . GERD (gastroesophageal reflux disease) 06/27/2012  . Environmental allergies 06/27/2012  . PCOS (polycystic ovarian syndrome) 06/27/2012  . Hydronephrosis 06/27/2012  . Breast nodule 01/30/2013  . Nephrolithiasis 08/07/2013  . Dermoid tumor 08/07/2013  . Dizziness 10/23/2014  . Anemia 10/23/2014  . Frequent UTI 10/29/2014  . Hair loss 10/29/2014  . Memory change 10/29/2014  . Abdominal pain 12/12/2014   Resolved Ambulatory Problems    Diagnosis Date Noted  . No Resolved Ambulatory Problems   Past Medical History  Diagnosis Date  . Allergy   . Chicken pox   . Urinary incontinence   . UTI (urinary tract infection)   . Polycystic ovarian syndrome   . Genital warts    Past Surgical History  Procedure Laterality Date  . Utetral stent placement w/ removal    . Dermoid tumor removal w/  partial ovary removal    . Tonsillectomy and adenoidectomy  1988  . Cholecystectomy  2011  . Partial oophorectomy      removed 1/2 of right ovary  . Right oophorectomy  05/19/2013    & bitaleral tubal removal     Medication List    ASK your doctor about these medications        ferrous sulfate 325 (65 FE) MG tablet  Take 325 mg by mouth daily.     metoCLOPramide 5 MG tablet  Commonly known as:  REGLAN  Take 1 tablet (5 mg total) by mouth 3 (three) times daily.     multivitamin with minerals Tabs tablet  Take 1 tablet by mouth daily.     ORTHO TRI-CYCLEN (28) 0.18/0.215/0.25 MG-35 MCG tablet  Generic drug:  Norgestimate-Ethinyl Estradiol Triphasic  Take 1 tablet by mouth daily.     pantoprazole 40 MG tablet  Commonly known as:  PROTONIX  Take 40 mg by mouth daily.     triamcinolone 55 MCG/ACT Aero nasal inhaler  Commonly known as:  NASACORT  Place 2 sprays into the nose daily.       Allergies  Allergen Reactions  . Percocet [Oxycodone-Acetaminophen] Nausea And Vomiting  . Chlorhexidine Rash    Social History   Social History  . Marital Status: Married    Spouse Name: N/A  . Number of Children: 1  . Years of Education: N/A   Occupational History  . Not on file.   Social History Main Topics  . Smoking status: Never Smoker   .  Smokeless tobacco: Never Used  . Alcohol Use: No     Comment: rare  . Drug Use: No  . Sexual Activity: Not on file   Other Topics Concern  . Not on file   Social History Narrative   Family History  Problem Relation Age of Onset  . Arthritis Mother   . Mental illness Mother   . Stroke Father   . High blood pressure Father   . Mental illness Father   . Diabetes Father   . Breast cancer Maternal Aunt   . Arthritis Maternal Grandmother   . High blood pressure Maternal Grandmother   . Diabetes Maternal Grandmother   . Dementia Maternal Grandmother   . High blood pressure Maternal Grandfather   . Diabetes Maternal  Grandfather   . Arthritis Paternal Grandmother   . High blood pressure Paternal Grandmother   . Kidney disease Paternal Grandmother   . Diabetes Paternal Grandmother   . Mental illness Paternal Grandfather    ROS: Full ROS obtained, pertinent positives and negatives as above  Blood pressure 109/52, pulse 81, temperature 98.8 F (37.1 C), resp. rate 16, height 5' (1.524 m), weight 217 lb (98.431 kg), last menstrual period 12/10/2014, SpO2 100 %. GEN: NAD/A&Ox3 FACE: no obvious facial trauma, normal external nose, normal external ears EYES: no scleral icterus, no conjunctivitis HEAD: normocephalic atraumatic CV: RRR, no MRG RESP: moving air well, lungs clear ABD: soft, tender RLQ to report but no obvious peritonitis, nondistended EXT: moving all ext well, strength 5/5 NEURO: cnII-XII grossly intact, sensation intact all 4 ext  Labs: Personally reviewed, significant for: AST 124, ALT 169, bili 0.3 WBC 6.6, 64% neutrophils  CT: Personally reviewed, significant for:  Some mildly enlarged RLQ lymph nodes, Appendix visualized and normal per radiology read (no RLQ inflammation, abscess per my read)  A/P 34 yo with 4 days of intractable nausea/vomiting, periumbilical, now RLQ pain.  + diarrhea.  Labs, CT relatively unremarkable except for mild elevation of LFT.  Normal WBC and unremarkable CT after 4 days of symptoms does not suggest appendicitis.  I favor viral enteritis at this time.  Will admit for IVF, nausea control.  No obvious surgical indications at this time.

## 2014-12-12 NOTE — Progress Notes (Signed)
Patient ID: Martha Miranda, female   DOB: 22-Jun-1980, 34 y.o.   MRN: [id number]   Subjective:    Patient ID: Martha Miranda, female    DOB: May 13, 1980, 34 y.o.   MRN: [id number]  HPI  Patient here for ER follow up.  Was seen in the ER on 12/10/14 with fever (103), increased abdominal pain and nausea.  CT as outlined.  Unrevealing for an etiology.  She was discharged on reglan and dilaudid.  She has continued to have a fever despite taking around the clock tylenol.  tmax today 101.  She is still having waves of increased pain.  Intensity will wax and wane, but never is completely resolved.  When she started the reglan, she started having loose stool.  Watery stool.  No blood.  No urinary issues.  She has been drinking some, but not able to eat.  Persistent nausea.  She will take reglan for nausea and then take immodium for the diarrhea.  Taking some hydrocodone for the pain.  Also describes the pain as burning inside.  Continues on protonix.     Past Medical History  Diagnosis Date  . Allergy   . GERD (gastroesophageal reflux disease)   . Chicken pox   . Depression   . Hypoglycemia   . Urinary incontinence   . UTI (urinary tract infection)   . Hydronephrosis   . Polycystic ovarian syndrome   . Genital warts    Past Surgical History  Procedure Laterality Date  . Utetral stent placement w/ removal    . 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 Cholecystectomy  2011  . Partial oophorectomy      removed 1/2 of right ovary  . Right oophorectomy  05/19/2013    & bitaleral tubal removal  . Tonsillectomy and adenoidectomy  1988   Family History  Problem Relation Age of Onset  . Arthritis Mother   . Mental illness Mother   . Stroke Father   . High blood pressure Father   . Mental illness Father   . Diabetes Father   . Breast cancer Maternal Aunt   . Arthritis Maternal Grandmother   . High blood pressure Maternal Grandmother   . Diabetes Maternal Grandmother   . Dementia  Maternal Grandmother   . High blood pressure Maternal Grandfather   . Diabetes Maternal Grandfather   . Arthritis Paternal Grandmother   . High blood pressure Paternal Grandmother   . Kidney disease Paternal Grandmother   . Diabetes Paternal Grandmother   . Mental illness Paternal Grandfather    Social History   Social History  . Marital Status: Married    Spouse Name: N/A  . Number of Children: 1  . Years of Education: N/A   Social History Main Topics  . Smoking status: Never Smoker   . Smokeless tobacco: Never Used  . Alcohol Use: No     Comment: rare  . Drug Use: No  . Sexual Activity: Yes    Birth Control/ Protection: Pill   Other Topics Concern  . None   Social History Narrative    No facility-administered encounter medications on file as of 12/12/2014.   Outpatient Encounter Prescriptions as of 12/12/2014  Medication Sig  . ferrous sulfate 325 (65 FE) MG tablet Take 325 mg by mouth daily.  . metoCLOPramide (REGLAN) 5 MG tablet Take 1 tablet (5 mg total) by mouth 3 (three) times daily. (Patient not taking: Reported on 12/12/2014)  . Multiple Vitamin (  MULTIVITAMIN WITH MINERALS) TABS Take 1 tablet by mouth daily.  . Norgestimate-Ethinyl Estradiol Triphasic (ORTHO TRI-CYCLEN, 28,) 0.18/0.215/0.25 MG-35 MCG tablet Take 1 tablet by mouth daily.  Marland Kitchen triamcinolone (NASACORT) 55 MCG/ACT AERO nasal inhaler Place 2 sprays into the nose daily. (Patient taking differently: Place 2 sprays into the nose daily as needed (for allergies). )  . [DISCONTINUED] pantoprazole (PROTONIX) 40 MG tablet TAKE 1 TABLET (40 MG TOTAL) BY MOUTH DAILY. (Patient not taking: Reported on 12/12/2014)  . [DISCONTINUED] belladona alk-PHENObarbital (DONNATAL) 16.2 MG tablet Take 1 tablet by mouth every 8 (eight) hours as needed.  . [DISCONTINUED] promethazine (PHENERGAN) 25 MG suppository Place 1 suppository (25 mg total) rectally every 6 (six) hours as needed for nausea.    Review of Systems    Constitutional: Positive for fever.       Decreased appetite.  Decreased po intake.  Decreased weight.    HENT: Negative for congestion and sinus pressure.   Respiratory: Negative for cough, chest tightness and shortness of breath.   Cardiovascular: Negative for chest pain, palpitations and leg swelling.  Gastrointestinal: Positive for nausea, abdominal pain and diarrhea. Negative for blood in stool.  Genitourinary: Negative for dysuria and difficulty urinating.  Musculoskeletal: Negative for back pain and joint swelling.  Skin: Negative for color change and rash.  Neurological: Negative for dizziness, light-headedness and headaches.  Psychiatric/Behavioral: Negative for agitation.       Upset about feeling bad.         Objective:    Physical Exam  Constitutional:  Appears not to feel well.  Dizzy and weak with going from chair to exam table.   HENT:  Nose: Nose normal.  Dry mucus membranes.   Neck: Neck supple.  Cardiovascular: Normal rate and regular rhythm.   Pulse rate 96.   Pulmonary/Chest: Breath sounds normal. No respiratory distress. She has no wheezes.  Abdominal: Soft.  Bowel sounds are present.  Increased diffuse pain (more localized to umbilicus) to palpation.    Musculoskeletal: She exhibits no edema or tenderness.  Lymphadenopathy:    She has no cervical adenopathy.  Skin: No rash noted. No erythema.    BP 100/60 mmHg  Pulse 83  Temp(Src) 98.7 F (37.1 C) (Oral)  Resp 18  Ht 5' 0.25" (1.53 m)  Wt 217 lb (98.431 kg)  BMI 42.05 kg/m2  SpO2 96%  LMP 12/12/2014 Wt Readings from Last 3 Encounters:  12/12/14 217 lb (98.431 kg)  12/12/14 217 lb (98.431 kg)  12/10/14 220 lb (99.791 kg)     Lab Results  Component Value Date   WBC 6.6 12/12/2014   HGB 12.3 12/12/2014   HCT 37.7 12/12/2014   PLT 233 12/12/2014   GLUCOSE 89 12/12/2014   ALT 169* 12/12/2014   AST 124* 12/12/2014   NA 139 12/12/2014   K 3.4* 12/12/2014   CL 105 12/12/2014   CREATININE  0.71 12/12/2014   BUN 7 12/12/2014   CO2 23 12/12/2014   TSH 0.49 10/23/2014    Ct Abdomen Pelvis W Contrast  12/10/2014  CLINICAL DATA:  Complaining of umbilical region pain for 4 days. Symptoms began last night associated with nausea, vomiting and fever. EXAM: CT ABDOMEN AND PELVIS WITH CONTRAST TECHNIQUE: Multidetector CT imaging of the abdomen and pelvis was performed using the standard protocol following bolus administration of intravenous contrast. CONTRAST:  1 OMNIPAQUE IOHEXOL 300 MG/ML  SOLN COMPARISON:  CT, 05/03/2013 FINDINGS: Lung bases: Clear.  Heart normal size. Liver, spleen, pancreas, adrenal glands:  Normal. Gallbladder surgically absent.  No bile duct dilation. Kidneys, ureters, bladder:  Normal. Uterus and adnexa: Normal uterus. Left ovary mildly prominent containing a 3.9 cm cyst. Lymph nodes:  No adenopathy. Ascites:  None. Gastrointestinal:  Normal.  Normal appendix visualized. Musculoskeletal: Mild disc and facet degenerative change at L4-L5. No osteoblastic or osteolytic lesions. IMPRESSION: 1. No acute findings. No findings to account for the patient's reported symptoms. 2. Status post cholecystectomy. 3. Left ovary mildly prominent due to an apparent 3.9 cm cyst, likely physiologic. Electronically Signed   By: Lajean Manes M.D.   On: 12/10/2014 14:12       Assessment & Plan:   Problem List Items Addressed This Visit    Abdominal pain - Primary    Persistent increased abdominal pain.  Not eating.  Having diarrhea now.  Question if related to reglan.  Persistent nausea.  Mucus membranes dry.  Weak and dizzy.  Feel needs IV hydration.  Also stop reglan, imodium and narcotic pain medication.  See what her body does without these medications.  Feel need repeat xray to confirm no increased bowel distention.  Question of need for repeat CT.  Pt agrees to ER evaluation.  ER notified.        Dizziness    Dizzy and weak today.  To ER as outlined for IV hydration and further  treatment.        GERD (gastroesophageal reflux disease)    Taking protonix.  Describes the burning sensation inside.  Persistent pain.  Refer over to ER as outlined.            Einar Pheasant, MD

## 2014-12-13 ENCOUNTER — Encounter: Payer: Self-pay | Admitting: Internal Medicine

## 2014-12-13 LAB — COMPREHENSIVE METABOLIC PANEL
ALBUMIN: 3.1 g/dL — AB (ref 3.5–5.0)
ALT: 114 U/L — AB (ref 14–54)
AST: 65 U/L — AB (ref 15–41)
Alkaline Phosphatase: 68 U/L (ref 38–126)
Anion gap: 6 (ref 5–15)
BUN: 7 mg/dL (ref 6–20)
CHLORIDE: 108 mmol/L (ref 101–111)
CO2: 28 mmol/L (ref 22–32)
CREATININE: 0.75 mg/dL (ref 0.44–1.00)
Calcium: 8 mg/dL — ABNORMAL LOW (ref 8.9–10.3)
GFR calc Af Amer: >60 mL/min (ref >60)
GLUCOSE: 103 mg/dL — AB (ref 65–99)
Potassium: 2.8 mmol/L — CL (ref 3.5–5.1)
Sodium: 142 mmol/L (ref 135–145)
Total Bilirubin: <0.1 mg/dL — ABNORMAL LOW (ref 0.3–1.2)
Total Protein: 5.9 g/dL — ABNORMAL LOW (ref 6.5–8.1)

## 2014-12-13 LAB — CBC
HCT: 32.5 % — ABNORMAL LOW (ref 35.0–47.0)
Hemoglobin: 11 g/dL — ABNORMAL LOW (ref 12.0–16.0)
MCH: 25.8 pg — AB (ref 26.0–34.0)
MCHC: 33.7 g/dL (ref 32.0–36.0)
MCV: 76.7 fL — AB (ref 80.0–100.0)
PLATELETS: 218 10*3/uL (ref 150–440)
RBC: 4.24 MIL/uL (ref 3.80–5.20)
RDW: 17.1 % — AB (ref 11.5–14.5)
WBC: 5.5 10*3/uL (ref 3.6–11.0)

## 2014-12-13 LAB — C DIFFICILE QUICK SCREEN W PCR REFLEX
C DIFFICLE (CDIFF) ANTIGEN: NEGATIVE
C Diff interpretation: NEGATIVE
C Diff toxin: NEGATIVE

## 2014-12-13 MED ORDER — PROMETHAZINE HCL 25 MG/ML IJ SOLN: 12.5000 mg | Freq: Four times a day (QID) | INTRAMUSCULAR | Status: DC | PRN

## 2014-12-13 MED ORDER — IBUPROFEN 600 MG PO TABS
600.0000 mg | ORAL_TABLET | Freq: Four times a day (QID) | ORAL | Status: DC | PRN
  Administered 2014-12-13 – 2014-12-14 (×2): 600 mg via ORAL
  Filled 2014-12-13 (×3): qty 1

## 2014-12-13 MED ORDER — KCL IN DEXTROSE-NACL 40-5-0.45 MEQ/L-%-% IV SOLN
INTRAVENOUS | Status: DC
  Administered 2014-12-13 – 2014-12-14 (×3): via INTRAVENOUS
  Filled 2014-12-13 (×5): qty 1000

## 2014-12-13 MED ORDER — PROMETHAZINE HCL 25 MG/ML IJ SOLN
12.5000 mg | Freq: Four times a day (QID) | INTRAMUSCULAR | Status: DC | PRN
Start: 2014-12-13 — End: 2014-12-13

## 2014-12-13 MED ORDER — POTASSIUM CHLORIDE 10 MEQ/100ML IV SOLN
10.0000 meq | INTRAVENOUS | Status: AC
  Administered 2014-12-13 (×4): 10 meq via INTRAVENOUS
  Filled 2014-12-13 (×4): qty 100

## 2014-12-13 MED ORDER — SIMETHICONE 40 MG/0.6ML PO SUSP
40.0000 mg | Freq: Four times a day (QID) | ORAL | Status: DC
  Administered 2014-12-13 – 2014-12-14 (×3): 40 mg via ORAL
  Filled 2014-12-13 (×6): qty 0.6

## 2014-12-13 MED ORDER — SIMETHICONE 80 MG PO CHEW
80.0000 mg | CHEWABLE_TABLET | Freq: Four times a day (QID) | ORAL | Status: DC | PRN
  Administered 2014-12-13: 80 mg via ORAL
  Filled 2014-12-13: qty 1

## 2014-12-13 NOTE — Assessment & Plan Note (Signed)
Taking protonix.  Describes the burning sensation inside.  Persistent pain.  Refer over to ER as outlined.

## 2014-12-13 NOTE — Progress Notes (Signed)
Initial Nutrition Assessment  DOCUMENTATION CODES:      INTERVENTION:  Meals and snacks: Cater to pt preferences Medical Nutrition Supplement Therapy: If unable to meet nutritional needs will add supplement   NUTRITION DIAGNOSIS:   Inadequate oral intake related to altered GI function as evidenced by per patient/family report.    GOAL:   Patient will meet greater than or equal to 90% of their needs    MONITOR:    (Energy intake, Gastrointestinal profile, )  REASON FOR ASSESSMENT:   Malnutrition Screening Tool    ASSESSMENT:      Pt admitted with abdominal pain, viral enteritis, diarrhea, nausea vomiting prior to admission  Past Medical History  Diagnosis Date  . Allergy   . GERD (gastroesophageal reflux disease)   . Chicken pox   . Depression   . Hypoglycemia   . Urinary incontinence   . UTI (urinary tract infection)   . Hydronephrosis   . Polycystic ovarian syndrome   . Genital warts     Current Nutrition: tolerating clear liquids, tray just delivered to start full liquids   Food/Nutrition-Related History: pt reports poor po intake for the past week secondary to diarrhea, abdominal pain, nausea, vomiting   Scheduled Medications:  . heparin  5,000 Units Subcutaneous 3 times per day  . Norgestimate-Ethinyl Estradiol Triphasic  1 tablet Oral Daily  . pantoprazole (PROTONIX) IV  40 mg Intravenous QHS    Continuous Medications:  . dextrose 5 % and 0.45 % NaCl with KCl 40 mEq/L 100 mL/hr at 12/13/14 0802     Electrolyte/Renal Profile and Glucose Profile:   Recent Labs Lab 12/10/14 1118 12/12/14 1726 12/13/14 0520  NA 135 139 142  K 3.7 3.4* 2.8*  CL 105 105 108  CO2 25 23 28   BUN 15 7 7   CREATININE 0.76 0.71 0.75  CALCIUM 8.1* 8.6* 8.0*  GLUCOSE 108* 89 103*   Protein Profile:  Recent Labs Lab 12/10/14 1118 12/12/14 1726 12/13/14 0520  ALBUMIN 3.8 3.8 3.1*    Gastrointestinal Profile: pt reports diarrhea has slowed down in  volume and frequency, no vomiting Last BM:10/19   Nutrition-Focused Physical Exam Findings: Nutrition-Focused physical exam completed. Findings are WDL for fat depletion, muscle depletion, and edema.     Weight Change: per wt encounter 8/16 wt of 223, current wt of 217 pounds.  2% weight loss in the last 2 months    Diet Order:  Diet full liquid Room service appropriate?: Yes; Fluid consistency:: Thin  Skin:   reviewed   Height:   Ht Readings from Last 1 Encounters:  12/12/14 5' (1.524 m)    Weight:   Wt Readings from Last 1 Encounters:  12/12/14 217 lb (98.431 kg)     BMI:  Body mass index is 42.38 kg/(m^2).  Estimated Nutritional Needs:   Kcal:  Using IBW of 45kg BEE 1076 kcals (IF 1.0-1.2, AF 1.3) 4270-6237 kcals/d.   Protein:  Using IBW of 45kg (1.0-1.2 g/kg) 45-54 g/d  Fluid:  Using IBW of 45kg (30-45ml/kg) 1350-1516ml/d  EDUCATION NEEDS:   No education needs identified at this time  MODERATE Care Level  Mikah Poss B. Zenia Resides, [place], [place] (pager)

## 2014-12-13 NOTE — Progress Notes (Addendum)
Patient A/O, no noted distress. Denies pain, but notes "discomfort." Poor appetite resulted to the four day N/v/D. Lungs are clear, Active bowel sounds, and skin intact.  Patient refused Heparin resulted she is continuing to be activate. Staff will continue to monitor and  Meet patient needs. Critical lab K 2.8, Dr. Rexene Edison notified at [phone]. MD notes he would follow up.

## 2014-12-13 NOTE — Progress Notes (Signed)
Surgery Progress Note  S: Some nausea after advancing diet.  + diarrhea.  Feels gassy O:Blood pressure 98/50, pulse 69, temperature 97.7 F (36.5 C), temperature source Oral, resp. rate 16, height 5' (1.524 m), weight 217 lb (98.431 kg), last menstrual period 12/12/2014, SpO2 100 %. GEN: NAD/A&Ox3 ABD: soft, min tender, nondistended  A/P 34 yo admit with ? Enteritis, tolerating liquids - liquids for now - nausea control - stool for c diff, O and P - replace lytes

## 2014-12-13 NOTE — Assessment & Plan Note (Signed)
Dizzy and weak today.  To ER as outlined for IV hydration and further treatment.

## 2014-12-14 LAB — COMPREHENSIVE METABOLIC PANEL
ALBUMIN: 3.3 g/dL — AB (ref 3.5–5.0)
ALT: 81 U/L — ABNORMAL HIGH (ref 14–54)
AST: 33 U/L (ref 15–41)
Alkaline Phosphatase: 82 U/L (ref 38–126)
Anion gap: 4 — ABNORMAL LOW (ref 5–15)
BILIRUBIN TOTAL: 0.1 mg/dL — AB (ref 0.3–1.2)
CHLORIDE: 108 mmol/L (ref 101–111)
CO2: 27 mmol/L (ref 22–32)
Calcium: 8.1 mg/dL — ABNORMAL LOW (ref 8.9–10.3)
Creatinine, Ser: 0.66 mg/dL (ref 0.44–1.00)
GFR calc Af Amer: >60 mL/min (ref >60)
GFR calc non Af Amer: >60 mL/min (ref >60)
GLUCOSE: 117 mg/dL — AB (ref 65–99)
POTASSIUM: 3.8 mmol/L (ref 3.5–5.1)
Sodium: 139 mmol/L (ref 135–145)
TOTAL PROTEIN: 6.1 g/dL — AB (ref 6.5–8.1)

## 2014-12-14 MED ORDER — ONDANSETRON 4 MG PO TBDP: 4.0000 mg | ORAL_TABLET | Freq: Four times a day (QID) | ORAL | Status: DC | PRN

## 2014-12-14 NOTE — Progress Notes (Addendum)
Patient A/O. No noted distress. Tolerated meds well. Patient noted she had x2 episodes of diarrhea. Denies pain, only discomfort. Patient slept well. Staff will continue to monitor and meet needs. Patient refuses Heparin. She on schedule simethicone. Administered prn simethicone which was effective/ Staff will continue to monitor and meet needs.

## 2014-12-14 NOTE — Discharge Summary (Signed)
Physician Discharge Summary  Patient ID: Martha Miranda MRN: [id number] DOB/AGE: 05/09/1980 34 y.o.  Admit date: 12/12/2014 Discharge date: 12/14/2014  Admission Diagnoses:  Enteritis  Discharge Diagnoses:  Active Problems:   Enteritis   Discharged Condition: good  Hospital Course: 34 yr old with 4 days of nausea, vomiting, diarrhea, fever and abdominal pain.  CT scan without any evidence of surgical process, but admitted for rehydration.  On day two she was able to tolerate regular diet.  Stool cultures negative for C. Diff and other pathogens.  Likely a viral enteritis.  Patient able to now keep up with nutritional needs now.    Consults: None  Significant Diagnostic Studies: see above  Treatments: IV hydration  Discharge Exam: Blood pressure 116/64, pulse 82, temperature 97.8 F (36.6 C), temperature source Oral, resp. rate 17, height 5' (1.524 m), weight 217 lb (98.431 kg), last menstrual period 12/12/2014, SpO2 99 %. General appearance: alert, cooperative and no distress GI: soft, mild distension, non-tender today Extremities: extremities normal, atraumatic, no cyanosis or edema  Disposition: 01-Home or Self Care  Discharge Instructions    Activity as tolerated - No restrictions    Complete by:  As directed      Call MD for:  persistant nausea and vomiting    Complete by:  As directed      Call MD for:  redness, tenderness, or signs of infection (pain, swelling, redness, odor or green/yellow discharge around incision site)    Complete by:  As directed      Call MD for:  severe uncontrolled pain    Complete by:  As directed      Call MD for:  temperature >100.4    Complete by:  As directed      Diet general    Complete by:  As directed      Increase activity slowly    Complete by:  As directed      May shower / Bathe    Complete by:  As directed      May walk up steps    Complete by:  As directed      No wound care    Complete by:  As directed              Medication List    STOP taking these medications        metoCLOPramide 5 MG tablet  Commonly known as:  REGLAN      TAKE these medications        ferrous sulfate 325 (65 FE) MG tablet  Take 325 mg by mouth daily.     multivitamin with minerals Tabs tablet  Take 1 tablet by mouth daily.     ondansetron 4 MG disintegrating tablet  Commonly known as:  ZOFRAN-ODT  Take 1 tablet (4 mg total) by mouth every 6 (six) hours as needed for nausea.     ORTHO TRI-CYCLEN (28) 0.18/0.215/0.25 MG-35 MCG tablet  Generic drug:  Norgestimate-Ethinyl Estradiol Triphasic  Take 1 tablet by mouth daily.     pantoprazole 40 MG tablet  Commonly known as:  PROTONIX  Take 40 mg by mouth daily.     triamcinolone 55 MCG/ACT Aero nasal inhaler  Commonly known as:  NASACORT  Place 2 sprays into the nose daily.           Follow-up Information    Follow up with [facility].   Why:  As needed      Signed: Lavona Mound  Daleysa Kristiansen 12/14/2014, 2:54 PM

## 2014-12-15 LAB — STOOL CULTURE: Culture: NOT DETECTED

## 2014-12-18 ENCOUNTER — Other Ambulatory Visit: Payer: Self-pay | Admitting: Surgery

## 2014-12-18 DIAGNOSIS — K529 Noninfective gastroenteritis and colitis, unspecified: Secondary | ICD-10-CM

## 2014-12-18 MED ORDER — CIPROFLOXACIN HCL 500 MG PO TABS: 500.0000 mg | ORAL_TABLET | Freq: Two times a day (BID) | ORAL | Status: DC

## 2014-12-18 MED ORDER — METRONIDAZOLE 500 MG PO TABS: 500.0000 mg | ORAL_TABLET | Freq: Three times a day (TID) | ORAL | Status: DC

## 2014-12-21 ENCOUNTER — Telehealth: Payer: Self-pay

## 2014-12-21 NOTE — Telephone Encounter (Signed)
Patient's disability work from [employer] was filled out and faxed. Patient was informed.

## 2014-12-21 NOTE — Telephone Encounter (Signed)
Called patient to let her know that her [facility]) forms were filled out and faxed.

## 2014-12-24 ENCOUNTER — Observation Stay: Payer: 59

## 2014-12-24 ENCOUNTER — Emergency Department: Payer: 59

## 2014-12-24 ENCOUNTER — Observation Stay: Payer: 59 | Admitting: Anesthesiology

## 2014-12-24 ENCOUNTER — Encounter: Payer: Self-pay | Admitting: Emergency Medicine

## 2014-12-24 ENCOUNTER — Encounter
Admission: EM | Disposition: A | Discharge status: Home / Self Care | Payer: Self-pay | Source: Home / Self Care | Attending: Emergency Medicine

## 2014-12-24 ENCOUNTER — Observation Stay
Admission: EM | Admit: 2014-12-24 | Discharge: 2014-12-26 | Disposition: A | Discharge status: Home / Self Care | Payer: 59 | Attending: General Surgery | Admitting: General Surgery

## 2014-12-24 DIAGNOSIS — Z9049 Acquired absence of other specified parts of digestive tract: Secondary | ICD-10-CM | POA: Diagnosis not present

## 2014-12-24 DIAGNOSIS — R1084 Generalized abdominal pain: Secondary | ICD-10-CM | POA: Insufficient documentation

## 2014-12-24 DIAGNOSIS — Z833 Family history of diabetes mellitus: Secondary | ICD-10-CM | POA: Diagnosis not present

## 2014-12-24 DIAGNOSIS — Z79899 Other long term (current) drug therapy: Secondary | ICD-10-CM | POA: Diagnosis not present

## 2014-12-24 DIAGNOSIS — R109 Unspecified abdominal pain: Secondary | ICD-10-CM | POA: Diagnosis present

## 2014-12-24 DIAGNOSIS — Z803 Family history of malignant neoplasm of breast: Secondary | ICD-10-CM | POA: Insufficient documentation

## 2014-12-24 DIAGNOSIS — E162 Hypoglycemia, unspecified: Secondary | ICD-10-CM | POA: Insufficient documentation

## 2014-12-24 DIAGNOSIS — R531 Weakness: Secondary | ICD-10-CM | POA: Diagnosis not present

## 2014-12-24 DIAGNOSIS — K219 Gastro-esophageal reflux disease without esophagitis: Secondary | ICD-10-CM | POA: Insufficient documentation

## 2014-12-24 DIAGNOSIS — R42 Dizziness and giddiness: Secondary | ICD-10-CM | POA: Diagnosis not present

## 2014-12-24 DIAGNOSIS — Z818 Family history of other mental and behavioral disorders: Secondary | ICD-10-CM | POA: Insufficient documentation

## 2014-12-24 DIAGNOSIS — Z888 Allergy status to other drugs, medicaments and biological substances status: Secondary | ICD-10-CM | POA: Insufficient documentation

## 2014-12-24 DIAGNOSIS — Z8249 Family history of ischemic heart disease and other diseases of the circulatory system: Secondary | ICD-10-CM | POA: Insufficient documentation

## 2014-12-24 DIAGNOSIS — N63 Unspecified lump in breast: Secondary | ICD-10-CM | POA: Insufficient documentation

## 2014-12-24 DIAGNOSIS — Z823 Family history of stroke: Secondary | ICD-10-CM | POA: Diagnosis not present

## 2014-12-24 DIAGNOSIS — K3580 Unspecified acute appendicitis: Principal | ICD-10-CM | POA: Insufficient documentation

## 2014-12-24 DIAGNOSIS — Z820 Family history of epilepsy and other diseases of the nervous system: Secondary | ICD-10-CM | POA: Diagnosis not present

## 2014-12-24 DIAGNOSIS — R32 Unspecified urinary incontinence: Secondary | ICD-10-CM | POA: Insufficient documentation

## 2014-12-24 DIAGNOSIS — D649 Anemia, unspecified: Secondary | ICD-10-CM | POA: Insufficient documentation

## 2014-12-24 DIAGNOSIS — F329 Major depressive disorder, single episode, unspecified: Secondary | ICD-10-CM | POA: Diagnosis not present

## 2014-12-24 DIAGNOSIS — Z87440 Personal history of urinary (tract) infections: Secondary | ICD-10-CM | POA: Diagnosis not present

## 2014-12-24 DIAGNOSIS — Z885 Allergy status to narcotic agent status: Secondary | ICD-10-CM | POA: Diagnosis not present

## 2014-12-24 DIAGNOSIS — K529 Noninfective gastroenteritis and colitis, unspecified: Secondary | ICD-10-CM | POA: Insufficient documentation

## 2014-12-24 DIAGNOSIS — E282 Polycystic ovarian syndrome: Secondary | ICD-10-CM | POA: Diagnosis not present

## 2014-12-24 DIAGNOSIS — N390 Urinary tract infection, site not specified: Secondary | ICD-10-CM | POA: Diagnosis not present

## 2014-12-24 DIAGNOSIS — Z841 Family history of disorders of kidney and ureter: Secondary | ICD-10-CM | POA: Insufficient documentation

## 2014-12-24 DIAGNOSIS — I880 Nonspecific mesenteric lymphadenitis: Secondary | ICD-10-CM | POA: Diagnosis not present

## 2014-12-24 DIAGNOSIS — R112 Nausea with vomiting, unspecified: Secondary | ICD-10-CM | POA: Diagnosis not present

## 2014-12-24 DIAGNOSIS — Z8261 Family history of arthritis: Secondary | ICD-10-CM | POA: Insufficient documentation

## 2014-12-24 DIAGNOSIS — D369 Benign neoplasm, unspecified site: Secondary | ICD-10-CM

## 2014-12-24 HISTORY — PX: LAPAROSCOPY: SHX197

## 2014-12-24 HISTORY — PX: LAPAROSCOPIC APPENDECTOMY: SHX408

## 2014-12-24 LAB — CBC WITH DIFFERENTIAL/PLATELET
Basophils Absolute: 0.1 10*3/uL (ref 0–0.1)
Basophils Relative: 1 %
EOS ABS: 0.2 10*3/uL (ref 0–0.7)
EOS PCT: 2 %
HCT: 34.4 % — ABNORMAL LOW (ref 35.0–47.0)
Hemoglobin: 11.4 g/dL — ABNORMAL LOW (ref 12.0–16.0)
LYMPHS ABS: 2.5 10*3/uL (ref 1.0–3.6)
Lymphocytes Relative: 25 %
MCH: 25.3 pg — AB (ref 26.0–34.0)
MCHC: 33.2 g/dL (ref 32.0–36.0)
MCV: 76 fL — ABNORMAL LOW (ref 80.0–100.0)
MONOS PCT: 7 %
Monocytes Absolute: 0.8 10*3/uL (ref 0.2–0.9)
Neutro Abs: 6.5 10*3/uL (ref 1.4–6.5)
Neutrophils Relative %: 65 %
PLATELETS: 315 10*3/uL (ref 150–440)
RBC: 4.53 MIL/uL (ref 3.80–5.20)
RDW: 16.6 % — ABNORMAL HIGH (ref 11.5–14.5)
WBC: 10.1 10*3/uL (ref 3.6–11.0)

## 2014-12-24 LAB — URINALYSIS COMPLETE WITH MICROSCOPIC (ARMC ONLY)
BILIRUBIN URINE: NEGATIVE
Glucose, UA: NEGATIVE mg/dL
HGB URINE DIPSTICK: NEGATIVE
Nitrite: NEGATIVE
PH: 7 (ref 5.0–8.0)
Protein, ur: NEGATIVE mg/dL
SPECIFIC GRAVITY, URINE: 1.012 (ref 1.005–1.030)

## 2014-12-24 LAB — CBC
HCT: 34.4 % — ABNORMAL LOW (ref 35.0–47.0)
Hemoglobin: 11.4 g/dL — ABNORMAL LOW (ref 12.0–16.0)
MCH: 25.2 pg — ABNORMAL LOW (ref 26.0–34.0)
MCHC: 33 g/dL (ref 32.0–36.0)
MCV: 76.3 fL — AB (ref 80.0–100.0)
PLATELETS: 311 10*3/uL (ref 150–440)
RBC: 4.51 MIL/uL (ref 3.80–5.20)
RDW: 16.6 % — ABNORMAL HIGH (ref 11.5–14.5)
WBC: 9.8 10*3/uL (ref 3.6–11.0)

## 2014-12-24 LAB — COMPREHENSIVE METABOLIC PANEL
ALT: 20 U/L (ref 14–54)
AST: 23 U/L (ref 15–41)
Albumin: 3.6 g/dL (ref 3.5–5.0)
Alkaline Phosphatase: 89 U/L (ref 38–126)
Anion gap: 10 (ref 5–15)
BUN: 8 mg/dL (ref 6–20)
CHLORIDE: 104 mmol/L (ref 101–111)
CO2: 24 mmol/L (ref 22–32)
CREATININE: 0.82 mg/dL (ref 0.44–1.00)
Calcium: 8.7 mg/dL — ABNORMAL LOW (ref 8.9–10.3)
Glucose, Bld: 109 mg/dL — ABNORMAL HIGH (ref 65–99)
Potassium: 3.2 mmol/L — ABNORMAL LOW (ref 3.5–5.1)
Sodium: 138 mmol/L (ref 135–145)
Total Bilirubin: 0.3 mg/dL (ref 0.3–1.2)
Total Protein: 6.7 g/dL (ref 6.5–8.1)

## 2014-12-24 LAB — POCT PREGNANCY, URINE: Preg Test, Ur: NEGATIVE

## 2014-12-24 LAB — SURGICAL PCR SCREEN
MRSA, PCR: NEGATIVE
Staphylococcus aureus: NEGATIVE

## 2014-12-24 LAB — LIPASE, BLOOD: LIPASE: 23 U/L (ref 11–51)

## 2014-12-24 SURGERY — APPENDECTOMY, LAPAROSCOPIC
Anesthesia: General | Site: Abdomen

## 2014-12-24 MED ORDER — PROMETHAZINE HCL 25 MG/ML IJ SOLN
12.5000 mg | Freq: Once | INTRAMUSCULAR | Status: AC
Start: 2014-12-24 — End: 2014-12-24
  Administered 2014-12-24: 12.5 mg via INTRAVENOUS

## 2014-12-24 MED ORDER — SODIUM CHLORIDE 0.9 % IV BOLUS (SEPSIS)
1000.0000 mL | Freq: Once | INTRAVENOUS | Status: AC
  Administered 2014-12-24: 1000 mL via INTRAVENOUS

## 2014-12-24 MED ORDER — PANTOPRAZOLE SODIUM 40 MG IV SOLR
40.0000 mg | Freq: Every day | INTRAVENOUS | Status: DC
  Administered 2014-12-24 – 2014-12-25 (×2): 40 mg via INTRAVENOUS
  Filled 2014-12-24 (×2): qty 40

## 2014-12-24 MED ORDER — FENTANYL CITRATE (PF) 100 MCG/2ML IJ SOLN
25.0000 ug | INTRAMUSCULAR | Status: DC | PRN
  Administered 2014-12-24: 18:00:00 via INTRAVENOUS
  Administered 2014-12-24 (×3): 25 ug via INTRAVENOUS

## 2014-12-24 MED ORDER — ONDANSETRON HCL 4 MG/2ML IJ SOLN: 4.0000 mg | Freq: Once | INTRAMUSCULAR | Status: DC | PRN

## 2014-12-24 MED ORDER — MIDAZOLAM HCL 2 MG/2ML IJ SOLN
INTRAMUSCULAR | Status: DC | PRN
  Administered 2014-12-24: 2 mg via INTRAVENOUS

## 2014-12-24 MED ORDER — HYDROCODONE-ACETAMINOPHEN 5-325 MG PO TABS
1.0000 | ORAL_TABLET | ORAL | Status: DC | PRN
  Administered 2014-12-24 – 2014-12-25 (×2): 1 via ORAL
  Administered 2014-12-25: 2 via ORAL
  Administered 2014-12-25: 1 via ORAL
  Filled 2014-12-24: qty 2
  Filled 2014-12-24 (×2): qty 1
  Filled 2014-12-24: qty 2

## 2014-12-24 MED ORDER — MORPHINE SULFATE (PF) 4 MG/ML IV SOLN: 4.0000 mg | Freq: Once | INTRAVENOUS | Status: DC

## 2014-12-24 MED ORDER — FENTANYL CITRATE (PF) 100 MCG/2ML IJ SOLN
INTRAMUSCULAR | Status: AC
  Filled 2014-12-24: qty 2

## 2014-12-24 MED ORDER — PROMETHAZINE HCL 25 MG/ML IJ SOLN
INTRAMUSCULAR | Status: AC
  Administered 2014-12-24: 12.5 mg via INTRAVENOUS
  Filled 2014-12-24: qty 1

## 2014-12-24 MED ORDER — ROCURONIUM BROMIDE 100 MG/10ML IV SOLN
INTRAVENOUS | Status: DC | PRN
  Administered 2014-12-24: 30 mg via INTRAVENOUS

## 2014-12-24 MED ORDER — ONDANSETRON HCL 4 MG/2ML IJ SOLN
4.0000 mg | Freq: Once | INTRAMUSCULAR | Status: AC
  Administered 2014-12-24: 4 mg via INTRAVENOUS

## 2014-12-24 MED ORDER — NEOSTIGMINE METHYLSULFATE 10 MG/10ML IV SOLN
INTRAVENOUS | Status: DC | PRN
  Administered 2014-12-24: 3 mg via INTRAVENOUS

## 2014-12-24 MED ORDER — DIPHENHYDRAMINE HCL 25 MG PO CAPS: 25.0000 mg | ORAL_CAPSULE | Freq: Four times a day (QID) | ORAL | Status: DC | PRN

## 2014-12-24 MED ORDER — DEXAMETHASONE SODIUM PHOSPHATE 4 MG/ML IJ SOLN
INTRAMUSCULAR | Status: DC | PRN
  Administered 2014-12-24: 8 mg via INTRAVENOUS

## 2014-12-24 MED ORDER — PROPOFOL 10 MG/ML IV BOLUS
INTRAVENOUS | Status: DC | PRN
  Administered 2014-12-24: 140 mg via INTRAVENOUS

## 2014-12-24 MED ORDER — ONDANSETRON 4 MG PO TBDP
4.0000 mg | ORAL_TABLET | Freq: Four times a day (QID) | ORAL | Status: DC | PRN
  Administered 2014-12-25: 4 mg via ORAL
  Filled 2014-12-24: qty 1

## 2014-12-24 MED ORDER — ONDANSETRON HCL 4 MG/2ML IJ SOLN
INTRAMUSCULAR | Status: DC | PRN
  Administered 2014-12-24: 4 mg via INTRAVENOUS

## 2014-12-24 MED ORDER — SODIUM CHLORIDE 0.9 % IV SOLN
3.0000 g | INTRAVENOUS | Status: DC
  Filled 2014-12-24 (×2): qty 3

## 2014-12-24 MED ORDER — BUPIVACAINE-EPINEPHRINE (PF) 0.25% -1:200000 IJ SOLN
INTRAMUSCULAR | Status: DC | PRN
  Administered 2014-12-24: 10 mL via PERINEURAL

## 2014-12-24 MED ORDER — DIPHENHYDRAMINE HCL 50 MG/ML IJ SOLN: 25.0000 mg | Freq: Four times a day (QID) | INTRAMUSCULAR | Status: DC | PRN

## 2014-12-24 MED ORDER — LIDOCAINE HCL (CARDIAC) 20 MG/ML IV SOLN
INTRAVENOUS | Status: DC | PRN
Start: 2014-12-24 — End: 2014-12-24
  Administered 2014-12-24: 50 mg via INTRAVENOUS

## 2014-12-24 MED ORDER — LIDOCAINE HCL (PF) 1 % IJ SOLN
INTRAMUSCULAR | Status: AC
  Filled 2014-12-24: qty 30

## 2014-12-24 MED ORDER — MORPHINE SULFATE (PF) 4 MG/ML IV SOLN
INTRAVENOUS | Status: AC
  Filled 2014-12-24: qty 1

## 2014-12-24 MED ORDER — ONDANSETRON HCL 4 MG/2ML IJ SOLN
INTRAMUSCULAR | Status: AC
  Administered 2014-12-24: 4 mg via INTRAVENOUS
  Filled 2014-12-24: qty 2

## 2014-12-24 MED ORDER — LIDOCAINE HCL 1 % IJ SOLN
INTRAMUSCULAR | Status: DC | PRN
Start: 2014-12-24 — End: 2014-12-24
  Administered 2014-12-24: 10 mL

## 2014-12-24 MED ORDER — LACTATED RINGERS IV SOLN
INTRAVENOUS | Status: DC
  Administered 2014-12-24 – 2014-12-26 (×5): via INTRAVENOUS

## 2014-12-24 MED ORDER — GLYCOPYRROLATE 0.2 MG/ML IJ SOLN
INTRAMUSCULAR | Status: DC | PRN
  Administered 2014-12-24: 0.6 mg via INTRAVENOUS

## 2014-12-24 MED ORDER — MORPHINE SULFATE (PF) 4 MG/ML IV SOLN
4.0000 mg | INTRAVENOUS | Status: DC | PRN
  Administered 2014-12-24: 4 mg via INTRAVENOUS
  Filled 2014-12-24: qty 1

## 2014-12-24 MED ORDER — HYDRALAZINE HCL 20 MG/ML IJ SOLN: 10.0000 mg | INTRAMUSCULAR | Status: DC | PRN

## 2014-12-24 MED ORDER — GADOBENATE DIMEGLUMINE 529 MG/ML IV SOLN
20.0000 mL | Freq: Once | INTRAVENOUS | Status: AC | PRN
  Administered 2014-12-24: 20 mL via INTRAVENOUS

## 2014-12-24 MED ORDER — LACTATED RINGERS IV SOLN
INTRAVENOUS | Status: DC | PRN
  Administered 2014-12-24: 17:00:00 via INTRAVENOUS

## 2014-12-24 MED ORDER — BUPIVACAINE-EPINEPHRINE (PF) 0.25% -1:200000 IJ SOLN
INTRAMUSCULAR | Status: AC
  Filled 2014-12-24: qty 30

## 2014-12-24 MED ORDER — SODIUM CHLORIDE 0.9 % IV SOLN
3.0000 g | INTRAVENOUS | Status: DC | PRN
  Administered 2014-12-24: 3 g via INTRAVENOUS

## 2014-12-24 MED ORDER — ONDANSETRON HCL 4 MG/2ML IJ SOLN
INTRAMUSCULAR | Status: AC
  Filled 2014-12-24: qty 4

## 2014-12-24 MED ORDER — SUCCINYLCHOLINE CHLORIDE 20 MG/ML IJ SOLN
INTRAMUSCULAR | Status: DC | PRN
  Administered 2014-12-24: 100 mg via INTRAVENOUS

## 2014-12-24 MED ORDER — ONDANSETRON HCL 4 MG/2ML IJ SOLN
4.0000 mg | Freq: Four times a day (QID) | INTRAMUSCULAR | Status: DC | PRN
  Administered 2014-12-24: 4 mg via INTRAVENOUS
  Filled 2014-12-24: qty 2

## 2014-12-24 SURGICAL SUPPLY — 55 items
APPLIER CLIP LOGIC TI 5 (MISCELLANEOUS) IMPLANT
APPLIER CLIP ROT 10 11.4 M/L (STAPLE)
APR CLP MED LRG 11.4X10 (STAPLE)
APR CLP MED LRG 33X5 (MISCELLANEOUS)
BAG COUNTER SPONGE EZ (MISCELLANEOUS) IMPLANT
BAG SPNG 4X4 CLR HAZ (MISCELLANEOUS)
BLADE SURG SZ11 CARB STEEL (BLADE) ×4 IMPLANT
CANISTER SUCT 1200ML W/VALVE (MISCELLANEOUS) ×4 IMPLANT
CATH TRAY 16F METER LATEX (MISCELLANEOUS) ×4 IMPLANT
CHLORAPREP W/TINT 26ML (MISCELLANEOUS) IMPLANT
CLIP APPLIE ROT 10 11.4 M/L (STAPLE) IMPLANT
CLOSURE WOUND 1/2 X4 (GAUZE/BANDAGES/DRESSINGS)
COUNTER SPONGE BAG EZ (MISCELLANEOUS)
CUTTER LINEAR ENDO 35 ART THIN (STAPLE) ×4 IMPLANT
DEFOGGER SCOPE WARMER CLEARIFY (MISCELLANEOUS) ×4 IMPLANT
DRSG TEGADERM 2-3/8X2-3/4 SM (GAUZE/BANDAGES/DRESSINGS) IMPLANT
DRSG TELFA 3X8 NADH (GAUZE/BANDAGES/DRESSINGS) IMPLANT
ENDOLOOP SUT PDS II  0 18 (SUTURE)
ENDOLOOP SUT PDS II 0 18 (SUTURE) IMPLANT
ENDOPOUCH RETRIEVER 10 (MISCELLANEOUS) IMPLANT
GLOVE BIO SURGEON STRL SZ7.5 (GLOVE) ×12 IMPLANT
GLOVE INDICATOR 8.0 STRL GRN (GLOVE) ×4 IMPLANT
GOWN STRL REUS W/ TWL LRG LVL3 (GOWN DISPOSABLE) ×4 IMPLANT
GOWN STRL REUS W/TWL LRG LVL3 (GOWN DISPOSABLE) ×6
IRRIGATION STRYKERFLOW (MISCELLANEOUS) IMPLANT
IRRIGATOR STRYKERFLOW (MISCELLANEOUS)
IV NS 1000ML (IV SOLUTION) ×4
IV NS 1000ML BAXH (IV SOLUTION) ×2 IMPLANT
KIT RM TURNOVER STRD PROC AR (KITS) ×4 IMPLANT
LABEL OR SOLS (LABEL) IMPLANT
LIQUID BAND (GAUZE/BANDAGES/DRESSINGS) ×8 IMPLANT
NEEDLE HYPO 25X1 1.5 SAFETY (NEEDLE) ×4 IMPLANT
NS IRRIG 500ML POUR BTL (IV SOLUTION) ×4 IMPLANT
PACK LAP CHOLECYSTECTOMY (MISCELLANEOUS) ×4 IMPLANT
PAD GROUND ADULT SPLIT (MISCELLANEOUS) ×4 IMPLANT
RELOAD CUTTER ETS 35MM STAND (ENDOMECHANICALS) ×8 IMPLANT
SCISSORS METZENBAUM CVD 33 (INSTRUMENTS) IMPLANT
SCRUB POVIDONE IODINE 4 OZ (MISCELLANEOUS) ×4 IMPLANT
SEAL FOR SCOPE WARMER C3101 (MISCELLANEOUS) IMPLANT
SLEEVE ENDOPATH XCEL 5M (ENDOMECHANICALS) ×4 IMPLANT
SOL PREP PVP 2OZ (MISCELLANEOUS) ×4
SOLUTION PREP PVP 2OZ (MISCELLANEOUS) ×2 IMPLANT
STRIP CLOSURE SKIN 1/2X4 (GAUZE/BANDAGES/DRESSINGS) IMPLANT
SUT MNCRL 4-0 (SUTURE) ×4
SUT MNCRL 4-0 27XMFL (SUTURE) ×2
SUT MNCRL+ 5-0 UNDYED PC-3 (SUTURE) ×2 IMPLANT
SUT MONOCRYL 5-0 (SUTURE) ×2
SUT VICRYL 0 AB UR-6 (SUTURE) ×4 IMPLANT
SUTURE MNCRL 4-0 27XMF (SUTURE) ×2 IMPLANT
SWABSTK COMLB BENZOIN TINCTURE (MISCELLANEOUS) IMPLANT
TROCAR XCEL 12X100 BLDLESS (ENDOMECHANICALS) ×4 IMPLANT
TROCAR XCEL BLUNT TIP 100MML (ENDOMECHANICALS) IMPLANT
TROCAR XCEL NON-BLD 5MMX100MML (ENDOMECHANICALS) ×4 IMPLANT
TUBING INSUFFLATOR HI FLOW (MISCELLANEOUS) IMPLANT
WATER STERILE IRR 1000ML POUR (IV SOLUTION) ×4 IMPLANT

## 2014-12-24 NOTE — Progress Notes (Signed)
Patient continues to have right lower quadrant abdominal pain. All imaging has been nonspecific.  The procedure of a diagnostic laparoscopy was again discussed in detail to include the risks, benefits and alternatives. The risks include, failure to resolve her pain, failure to make a diagnosis, possible injury to intestines, possible need for more surgery and possible prolonged hospitalization. Patient and her husband voiced understanding and wish to proceed. Will proceed with a diagnostic laparoscopy with appendectomy.   Clayburn Pert, MD [facility] Surgeon [facility] 12/24/2014 @1527 

## 2014-12-24 NOTE — Progress Notes (Signed)
Refused CHG bath due to allergies

## 2014-12-24 NOTE — ED Notes (Signed)
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  Started on Flagyl and Cipro on Tuesday.  Has been taking medication.  Lower abdominal pain started again last night with nausea. Reprots an 18 pound weight loss over the past two weeks.

## 2014-12-24 NOTE — Transfer of Care (Signed)
Immediate Anesthesia Transfer of Care Note  Patient: Martha Miranda  Procedure(s) Performed: Procedure(s): APPENDECTOMY LAPAROSCOPIC (N/A) LAPAROSCOPY DIAGNOSTIC  Patient Location: PACU  Anesthesia Type:General  Level of Consciousness: awake  Airway & Oxygen Therapy: Patient Spontanous Breathing  Post-op Assessment: Report given to RN  Post vital signs: stable  Last Vitals:  Filed Vitals:   12/24/14 1738  BP: 108/73  Pulse: 93  Temp: 36.6 C  Resp: 19    Complications: No apparent anesthesia complications

## 2014-12-24 NOTE — Brief Op Note (Signed)
12/24/2014  5:34 PM  PATIENT:  Martha Miranda  34 y.o. female  PRE-OPERATIVE DIAGNOSIS:  abdominal pain  POST-OPERATIVE DIAGNOSIS:  abdominal pain  PROCEDURE:  Procedure(s): APPENDECTOMY LAPAROSCOPIC (N/A) LAPAROSCOPY DIAGNOSTIC  SURGEON:  Surgeon(s) and Role:    * Clayburn Pert, MD - Primary  PHYSICIAN ASSISTANT:   ASSISTANTS: none   ANESTHESIA:   general  EBL:  Total I/O In: 420 [I.V.:420] Out: -   BLOOD ADMINISTERED:none  DRAINS: none   LOCAL MEDICATIONS USED:  BUPIVICAINE , LIDOCAINE  and Amount: 20 ml  SPECIMEN:  Source of Specimen:  appendix  DISPOSITION OF SPECIMEN:  PATHOLOGY  COUNTS:  YES  TOURNIQUET:  * No tourniquets in log *  DICTATION: .Dragon Dictation  PLAN OF CARE: return to inpatient status  PATIENT DISPOSITION:  PACU - hemodynamically stable.   Delay start of Pharmacological VTE agent (>24hrs) due to surgical blood loss or risk of bleeding: no

## 2014-12-24 NOTE — ED Notes (Signed)
Pt updated of plan to admit.

## 2014-12-24 NOTE — Anesthesia Procedure Notes (Signed)
Procedure Name: Intubation Date/Time: 12/24/2014 4:35 PM Performed by: CNOBSJG, Kalani Sthilaire Pre-anesthesia Checklist: Patient identified, Emergency Drugs available, Suction available, Patient being monitored and Timeout performed Oxygen Delivery Method: Circle system utilized Preoxygenation: Pre-oxygenation with 100% oxygen Laryngoscope Size: Mac and 3 Grade View: Grade I Tube type: Oral Number of attempts: 1 Secured at: 6.5 cm Tube secured with: Tape

## 2014-12-24 NOTE — ED Provider Notes (Signed)
[facility] Emergency Department Provider Note  ____________________________________________  Time seen: Approximately 7:24 AM  I have reviewed the triage vital signs and the nursing notes.   HISTORY  Chief Complaint Abdominal Pain and Nausea    HPI Martha Miranda is a 34 y.o. female patient had started having abdominal pain and nausea approximately on the 12th of this month. Was seen in the emergency room on the 16th CT was negative patient was discharged home came back on the 18th with more abdominal pain nausea and some vomiting. Patient had another CT on the 18th which showed mesenteric adenitis patient was hospitalized given Cipro and Flagyl improved markedly. However the last couple days patient has had return of her symptoms and the pain has again localized to the right lower quadrant. Patient is having a lot of nausea with it. Movement in the bumps in the road and an palpation and percussion all makes the pain worse. Patient reports she had her right-sided ovary taken out some years ago because of a dermoid cyst. There is a cyst that was seen on the CT scan on the left ovary now too. Patient reports she's lost about 14 pounds in the last 2 weeks.  Past Medical History  Diagnosis Date  . Allergy   . GERD (gastroesophageal reflux disease)   . Chicken pox   . Depression   . Hypoglycemia   . Urinary incontinence   . UTI (urinary tract infection)   . Hydronephrosis   . Polycystic ovarian syndrome   . Genital warts     Patient Active Problem List   Diagnosis Date Noted  . Abdominal pain 12/12/2014  . Enteritis 12/12/2014  . Frequent UTI 10/29/2014  . Hair loss 10/29/2014  . Memory change 10/29/2014  . Dizziness 10/23/2014  . Anemia 10/23/2014  . Nephrolithiasis 08/07/2013  . Dermoid tumor 08/07/2013  . Breast nodule 01/30/2013  . Depression 06/27/2012  . Hypoglycemia 06/27/2012  . GERD (gastroesophageal reflux disease) 06/27/2012  .  Environmental allergies 06/27/2012  . PCOS (polycystic ovarian syndrome) 06/27/2012  . Hydronephrosis 06/27/2012    Past Surgical History  Procedure Laterality Date  . Utetral stent placement w/ removal    . 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 Cholecystectomy  2011  . Partial oophorectomy      removed 1/2 of right ovary  . Right oophorectomy  05/19/2013    & bitaleral tubal removal  . Tonsillectomy and adenoidectomy  1988    Current Outpatient Rx  Name  Route  Sig  Dispense  Refill  . ciprofloxacin (CIPRO) 500 MG tablet   Oral   Take 1 tablet (500 mg total) by mouth 2 (two) times daily.   28 tablet   0   . ferrous sulfate 325 (65 FE) MG tablet   Oral   Take 325 mg by mouth daily.         . metroNIDAZOLE (FLAGYL) 500 MG tablet   Oral   Take 1 tablet (500 mg total) by mouth 3 (three) times daily.   42 tablet   0   . Multiple Vitamin (MULTIVITAMIN WITH MINERALS) TABS   Oral   Take 1 tablet by mouth daily.         . Norgestimate-Ethinyl Estradiol Triphasic (ORTHO TRI-CYCLEN, 28,) 0.18/0.215/0.25 MG-35 MCG tablet   Oral   Take 1 tablet by mouth daily.         . ondansetron (ZOFRAN-ODT) 4 MG disintegrating tablet   Oral  Take 1 tablet (4 mg total) by mouth every 6 (six) hours as needed for nausea.   30 tablet   1   . pantoprazole (PROTONIX) 40 MG tablet   Oral   Take 40 mg by mouth daily.         Marland Kitchen triamcinolone (NASACORT) 55 MCG/ACT AERO nasal inhaler   Nasal   Place 2 sprays into the nose daily. Patient taking differently: Place 2 sprays into the nose daily as needed (for allergies).    1 Inhaler   5   . metoCLOPramide (REGLAN) 5 MG tablet   Oral   Take 5 mg by mouth 3 (three) times daily.      0     Allergies Percocet and Chlorhexidine  Family History  Problem Relation Age of Onset  . Arthritis Mother   . Mental illness Mother   . Stroke Father   . High blood pressure Father   . Mental illness Father   . Diabetes  Father   . Breast cancer Maternal Aunt   . Arthritis Maternal Grandmother   . High blood pressure Maternal Grandmother   . Diabetes Maternal Grandmother   . Dementia Maternal Grandmother   . High blood pressure Maternal Grandfather   . Diabetes Maternal Grandfather   . Arthritis Paternal Grandmother   . High blood pressure Paternal Grandmother   . Kidney disease Paternal Grandmother   . Diabetes Paternal Grandmother   . Mental illness Paternal Grandfather     Social History Social History  Substance Use Topics  . Smoking status: Never Smoker   . Smokeless tobacco: Never Used  . Alcohol Use: No     Comment: rare    Review of Systems Constitutional: No fever/chills Eyes: No visual changes. ENT: No sore throat. Cardiovascular: Denies chest pain. Respiratory: Denies shortness of breath. Gastrointestinal: No diarrhea.  No constipation. Genitourinary: Negative for dysuria. Musculoskeletal: Negative for back pain. Skin: Negative for rash. Neurological: Negative for headaches, focal weakness or numbness.  10-point ROS otherwise negative.  ____________________________________________   PHYSICAL EXAM:  VITAL SIGNS: ED Triage Vitals  Enc Vitals Group     BP 12/24/14 0254 104/58 mmHg     Pulse Rate 12/24/14 0254 87     Resp 12/24/14 0254 24     Temp 12/24/14 0253 97.5 F (36.4 C)     Temp Source 12/24/14 0253 Oral     SpO2 12/24/14 0254 99 %     Weight 12/24/14 0254 214 lb (97.07 kg)     Height 12/24/14 0254 5' (1.524 m)     Head Cir --      Peak Flow --      Pain Score 12/24/14 0256 4     Pain Loc --      Pain Edu? --      Excl. in [place]? --     Constitutional: Alert and oriented. Well appearing and in no acute distress. Eyes: Conjunctivae are normal. PERRL. EOMI. Head: Atraumatic. Nose: No congestion/rhinnorhea. Mouth/Throat: Mucous membranes are moist.  Oropharynx non-erythematous. Neck: No stridor.  Cardiovascular: Normal rate, regular rhythm. Grossly normal  heart sounds.  Good peripheral circulation. Respiratory: Normal respiratory effort.  No retractions. Lungs CTAB. Gastrointestinal: Soft bowel sounds are somewhat decreased. Patient has pain in the right lower quadrant worse with palpation and percussion. Patient also has referred pain to the right lower quadrant to palpate or percuss elsewhere in the belly. No distention. No abdominal bruits. No CVA tenderness. Musculoskeletal: No lower extremity tenderness nor  edema.  No joint effusions. Neurologic:  Normal speech and language. No gross focal neurologic deficits are appreciated.  Skin:  Skin is warm, dry and intact. No rash noted. Psychiatric: Mood and affect are normal. Speech and behavior are normal.  ____________________________________________   LABS (all labs ordered are listed, but only abnormal results are displayed)  Labs Reviewed  COMPREHENSIVE METABOLIC PANEL - Abnormal; Notable for the following:    Potassium 3.2 (*)    Glucose, Bld 109 (*)    Calcium 8.7 (*)    All other components within normal limits  CBC - Abnormal; Notable for the following:    Hemoglobin 11.4 (*)    HCT 34.4 (*)    MCV 76.3 (*)    MCH 25.2 (*)    RDW 16.6 (*)    All other components within normal limits  URINALYSIS COMPLETEWITH MICROSCOPIC ([facility] ONLY) - Abnormal; Notable for the following:    Color, Urine YELLOW (*)    APPearance CLEAR (*)    Ketones, ur TRACE (*)    Leukocytes, UA TRACE (*)    Bacteria, UA RARE (*)    Squamous Epithelial / LPF 6-30 (*)    All other components within normal limits  CBC WITH DIFFERENTIAL/PLATELET - Abnormal; Notable for the following:    Hemoglobin 11.4 (*)    HCT 34.4 (*)    MCV 76.0 (*)    MCH 25.3 (*)    RDW 16.6 (*)    All other components within normal limits  LIPASE, BLOOD  POC URINE PREG, ED  POCT PREGNANCY, URINE   ____________________________________________  EKG   ____________________________________________  RADIOLOGY  Three-way  abdomen is normal ____________________________________________   PROCEDURES  I discussed the patient's case with the surgical list. He will come down and evaluate her I'm reluctant to CT her again that we've the third CT this month. Considering either an MRI or a ultrasound of the pelvis but we'll wait for the surgeon to see her to  decide way to go.  ____________________________________________   INITIAL IMPRESSION / [place] / ED COURSE  Pertinent labs & imaging results that were available during my care of the patient were reviewed by me and considered in my medical decision making (see chart for details).  ____________________________________________   FINAL CLINICAL IMPRESSION(S) / ED DIAGNOSES  Final diagnoses:  Abdominal pain      Nena Polio, MD 12/24/14 [phone]

## 2014-12-24 NOTE — Anesthesia Preprocedure Evaluation (Addendum)
Anesthesia Evaluation  Patient identified by MRN, date of birth, ID band Patient awake    Reviewed: Allergy & Precautions, NPO status , Patient's Chart, lab work & pertinent test results, reviewed documented beta blocker date and time   Airway Mallampati: III  TM Distance: >3 FB     Dental  (+) Chipped   Pulmonary           Cardiovascular      Neuro/Psych PSYCHIATRIC DISORDERS Depression    GI/Hepatic GERD  ,  Endo/Other  Morbid obesity  Renal/GU Renal InsufficiencyRenal disease     Musculoskeletal   Abdominal   Peds  Hematology  (+) anemia ,   Anesthesia Other Findings   Reproductive/Obstetrics                             Anesthesia Physical Anesthesia Plan  ASA: III  Anesthesia Plan: General   Post-op Pain Management:    Induction: Intravenous  Airway Management Planned: Oral ETT  Additional Equipment:   Intra-op Plan:   Post-operative Plan:   Informed Consent: I have reviewed the patients History and Physical, chart, labs and discussed the procedure including the risks, benefits and alternatives for the proposed anesthesia with the patient or authorized representative who has indicated his/her understanding and acceptance.     Plan Discussed with: CRNA  Anesthesia Plan Comments:         Anesthesia Quick Evaluation

## 2014-12-24 NOTE — ED Notes (Signed)
Patient states she is not passing flatus.  Only burping.

## 2014-12-24 NOTE — H&P (Signed)
Patient ID: Phillips Odor, female   DOB: 06-03-1980, 34 y.o.   MRN: [id number] CC: ABDOMINAL PAIN HPI Almadelia REYA AURICH is a 34 y.o. 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 She presents emergency department with a worsening right lower quadrant pain, fevers, nausea and vomiting. She has been having intermittent to severe abdominal pain for the last 18 days. She was admitted for this 2 weeks ago and diagnosed with a viral enteritis and mesenteric adenitis. She initially recovered from that hospitalization however earlier this week had a return of fevers at home up to 103 and was started on oral antibiotics of Cipro and Flagyl. After 2 days of those antibiotics her pain went away. That was Thursday. However over the last 36 hours the pain has returned along with the nausea, vomiting, diarrhea. She is frustrated and miserable. She states that a stomach bug has gone through her family however they are recovered within 48 hours. She states the pain has stayed in her right lower quadrant for the last several days without any improvement. The pain is achy and throbbing in nature. She states her last menstrual cycle was just before this episode of symptoms began 2 weeks ago. Her last bowel movement was earlier this morning and was loose and flaky per her description. She denies any chest pain, shortness of breath, cough, dysuria. She admits to nausea, vomiting, anorexia, diarrhea, abdominal pain, malaise, weight loss.  HPI  Past Medical History  Diagnosis Date  . Allergy   . GERD (gastroesophageal reflux disease)   . Chicken pox   . Depression   . Hypoglycemia   . Urinary incontinence   . UTI (urinary tract infection)   . Hydronephrosis   . Polycystic ovarian syndrome   . Genital warts     Past Surgical History  Procedure Laterality Date  . Utetral stent placement w/ removal    . 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 Cholecystectomy  2011  . Partial oophorectomy      removed 1/2  of right ovary  . Right oophorectomy  05/19/2013    & bitaleral tubal removal  . Tonsillectomy and adenoidectomy  1988    Family History  Problem Relation Age of Onset  . Arthritis Mother   . Mental illness Mother   . Stroke Father   . High blood pressure Father   . Mental illness Father   . Diabetes Father   . Breast cancer Maternal Aunt   . Arthritis Maternal Grandmother   . High blood pressure Maternal Grandmother   . Diabetes Maternal Grandmother   . Dementia Maternal Grandmother   . High blood pressure Maternal Grandfather   . Diabetes Maternal Grandfather   . Arthritis Paternal Grandmother   . High blood pressure Paternal Grandmother   . Kidney disease Paternal Grandmother   . Diabetes Paternal Grandmother   . Mental illness Paternal Grandfather     Social History Social History  Substance Use Topics  . Smoking status: Never Smoker   . Smokeless tobacco: Never Used  . Alcohol Use: No     Comment: rare    Allergies  Allergen Reactions  . Percocet [Oxycodone-Acetaminophen] Nausea And Vomiting  . Chlorhexidine Rash    Current Facility-Administered Medications  Medication Dose Route Frequency Provider Last Rate Last Dose  . morphine 4 MG/ML injection 4 mg  4 mg Intravenous Once Nena Polio, MD       Current Outpatient Prescriptions  Medication Sig Dispense Refill  .  ciprofloxacin (CIPRO) 500 MG tablet Take 1 tablet (500 mg total) by mouth 2 (two) times daily. 28 tablet 0  . ferrous sulfate 325 (65 FE) MG tablet Take 325 mg by mouth daily.    . metroNIDAZOLE (FLAGYL) 500 MG tablet Take 1 tablet (500 mg total) by mouth 3 (three) times daily. 42 tablet 0  . Multiple Vitamin (MULTIVITAMIN WITH MINERALS) TABS Take 1 tablet by mouth daily.    . Norgestimate-Ethinyl Estradiol Triphasic (ORTHO TRI-CYCLEN, 28,) 0.18/0.215/0.25 MG-35 MCG tablet Take 1 tablet by mouth daily.    . ondansetron (ZOFRAN-ODT) 4 MG disintegrating tablet Take 1 tablet (4 mg total) by mouth  every 6 (six) hours as needed for nausea. 30 tablet 1  . pantoprazole (PROTONIX) 40 MG tablet Take 40 mg by mouth daily.    Marland Kitchen triamcinolone (NASACORT) 55 MCG/ACT AERO nasal inhaler Place 2 sprays into the nose daily. (Patient taking differently: Place 2 sprays into the nose daily as needed (for allergies). ) 1 Inhaler 5  . metoCLOPramide (REGLAN) 5 MG tablet Take 5 mg by mouth 3 (three) times daily.  0     Review of Systems A multipoint review of systems was completed. All pertinent positives and negatives within the history of present illness remainder were negative.  Physical Exam Blood pressure 125/80, pulse 71, temperature 97.5 F (36.4 C), temperature source Oral, resp. rate 14, height 5' (1.524 m), weight 97.07 kg (214 lb), last menstrual period 12/12/2014, SpO2 96 %. CONSTITUTIONAL: Resting in bed in obvious discomfort. EYES: Pupils are equal, round, and reactive to light, Sclera are non-icteric. EARS, NOSE, MOUTH AND THROAT: The oropharynx is clear. The oral mucosa is pink and moist. Hearing is intact to voice. LYMPH NODES:  Lymph nodes in the neck are normal. RESPIRATORY:  Lungs are clear. There is normal respiratory effort, with equal breath sounds bilaterally, and without pathologic use of accessory muscles. CARDIOVASCULAR: Heart is regular without murmurs, gallops, or rubs. GI: The abdomen is large, soft, tender to palpation in the right lower quadrant with referred pain to the right lower quadrant with palpation of the left lower and upper quadrant, and nondistended. There are no palpable masses. There is no hepatosplenomegaly. There are normal bowel sounds in all quadrants. GU: Rectal deferred.   MUSCULOSKELETAL: Normal muscle strength and tone. No cyanosis or edema.   SKIN: Turgor is good and there are no pathologic skin lesions or ulcers. NEUROLOGIC: Motor and sensation is grossly normal. Cranial nerves are grossly intact. PSYCH:  Oriented to person, place and time. Affect is  normal.  Data Reviewed Her recent images and labs have all been reviewed by me. Labs today are mostly normal with white blood cell count of 10.1 with a mild left shift of 65% neutrophils. Her electrolytes today show a hypokalemia. Reviewed her previous 2 CT scans plus her abdominal plain films obtained today. Abdominal plain films showed nondistended bowel gas pattern but no evidence of bowel obstructive. Her CT scan that was performed 2 weeks ago does show a multifocal mesenteric adenitis. There is no obvious appendicitis visualized on it however the appendix is difficult to visualize. I have personally reviewed the patient's imaging, laboratory findings and medical records.    Assessment    34 year old female with right lower quadrant abdominal pain    Plan    Long discussion with her about her numerous negative findings on images and labs. However given her exam it is very concerning for the possibility of missed appendicitis. Patient and I wish  to avoid unnecessary repeat CT scan at this point and will attempt to obtain appendiceal identification with ultrasound. Plan for admission to the [facility] ward under observation. We'll provide with IV hydration, pain relief, nausea relief. Should her abdominal pain continue possibly proceed later today with a diagnostic laparoscopy with appendectomy. Discussed again in length with patient that there is a high probability of a normal appendix however given her symptomatology and physical findings today I currently am concerned for appendicitis. Should we proceed with an operation and her appendix be normal, I would still plan to remove her appendix followed by consultation with gastroenterology.     Time spent with the patient was 45 minutes, with more than 50% of the time spent in face-to-face education, counseling and care coordination.     Clayburn Pert 12/24/2014, 8:33 AM

## 2014-12-24 NOTE — ED Notes (Signed)
Pt reports right sided abd pain, nausea, soft stools, burping, fever x about 15 days.  Pt hospitalized, dx with mesenteric adonitis.  Pt reports losing 20 lbs in 2 weeks, unable to work or eat.  Pt in mild distress, respirations occasionally labored, skin warm and dry.

## 2014-12-24 NOTE — Op Note (Signed)
Diagnostic laparascopicy with appendectomy   Phillips Odor Date of operation:  12/24/2014  Indications: The patient presented with a history of  abdominal pain. Workup has revealed findings consistent with acute appendicitis.  Pre-operative Diagnosis: Abdominal pain, right lower quadrant  Post-operative Diagnosis: Same  Surgeon: Juanda Crumble T. Adonis Huguenin, MD, FACS  Anesthesia: General with endotracheal tube  Procedure Details  The patient was seen again in the preop area. The options of surgery versus observation were reviewed with the patient and/or family. The risks of bleeding, infection, recurrence of symptoms, negative laparoscopy, potential for an open procedure, bowel injury, abscess or infection, were all reviewed as well. The patient was taken to Operating Room, identified as [employer] and the procedure verified as laparoscopic appendectomy. A Time Out was held and the above information confirmed.  The patient was placed in the supine position and general anesthesia was induced.  Antibiotic prophylaxis was administered and VT E prophylaxis was in place. A Foley catheter was placed by the nursing staff.   The abdomen was prepped and draped in a sterile fashion. A left upper quadran incision was made 2 cm below the costal margin in the mid clavicular line. A Veress needle was placed and pneumoperitoneum was obtained. A 5 mm trocar port was placed without difficulty and the abdominal cavity was explored.  Under direct vision a 5 mm suprapubic port was placed and a 12 mm left lateral port was placed all under direct vision.  The appendix was identified and found to not be acutely inflamed, however the meso-appendix did seem boggy. The appendix was carefully dissected. The base of the appendix was dissected out and divided with a standard load Endo GIA. The mesoappendix was divided with a vascular load Endo GIA. Two vascular loads were needed to completely free the appendix. The appendix was  passed out through the left lateral port site with the aid of an Endo Catch bag. The small bowel was then run laparoscopically to try to identify any other cause of her right lower quadrant abdominal pain. The only other finding was of adhesions to her prior c-section incision. Inspection  failed to identify any additional bleeding and there were no signs of bowel injury.   Again the right lower quadrant was inspected there was no sign of bleeding or bowel injury therefore pneumoperitoneum was released, all ports were removed and the skin incisions were approximated with subcuticular 4-0 Monocryl. Dermabond was applied to each site. The patient tolerated the procedure well, there were no complications. The sponge lap and needle count were correct at the end of the procedure.  The patient was taken to the recovery room in stable condition to be admitted for continued care.  Findings: Normal appearing appendix  Estimated Blood Loss: 13ml                  Specimens: appendix         Complications:  none                  Clayburn Pert MD, FACS

## 2014-12-25 ENCOUNTER — Encounter: Payer: Self-pay | Admitting: General Surgery

## 2014-12-25 MED ORDER — ONDANSETRON 4 MG PO TBDP: 4.0000 mg | ORAL_TABLET | Freq: Four times a day (QID) | ORAL | Status: DC | PRN

## 2014-12-25 MED ORDER — PROMETHAZINE HCL 25 MG RE SUPP
25.0000 mg | Freq: Three times a day (TID) | RECTAL | Status: DC | PRN
  Administered 2014-12-25: 25 mg via RECTAL
  Filled 2014-12-25: qty 1

## 2014-12-25 MED ORDER — HYDROCODONE-ACETAMINOPHEN 5-325 MG PO TABS: 1.0000 | ORAL_TABLET | ORAL | Status: DC | PRN

## 2014-12-25 MED ORDER — IBUPROFEN 400 MG PO TABS
600.0000 mg | ORAL_TABLET | Freq: Four times a day (QID) | ORAL | Status: DC | PRN
  Administered 2014-12-25 – 2014-12-26 (×3): 600 mg via ORAL
  Filled 2014-12-25 (×3): qty 2

## 2014-12-25 NOTE — Progress Notes (Signed)
Initial Nutrition Assessment     INTERVENTION:  Meals and snacks: Cater to pt preferences Medical Nutrition Supplement: If unable to meet nutritional needs will add supplement   NUTRITION DIAGNOSIS:   Inadequate oral intake related to altered GI function as evidenced by per patient/family report.    GOAL:   Patient will meet greater than or equal to 90% of their needs    MONITOR:    (Energy intake, Digestive system)  REASON FOR ASSESSMENT:   Malnutrition Screening Tool    ASSESSMENT:      Pt s/p lap appendectomy with mesenteric adenitis.   Recent admission for  Viral enteritis with nausea, vomiting, diarrhea  Past Medical History  Diagnosis Date  . Allergy   . GERD (gastroesophageal reflux disease)   . Chicken pox   . Depression   . Hypoglycemia   . Urinary incontinence   . UTI (urinary tract infection)   . Hydronephrosis   . Polycystic ovarian syndrome   . Genital warts     Current Nutrition: took few bites of sandwich for lunch today, with bites of mac and cheese and mashed potatoes.     Food/Nutrition-Related History: pt reports decreased intake for the past 3 weeks secondary to nausea, vomiting and diarrhea   Scheduled Medications:  . pantoprazole (PROTONIX) IV  40 mg Intravenous QHS    Continuous Medications:  . lactated ringers 150 mL/hr at 12/25/14 0603     Electrolyte/Renal Profile and Glucose Profile:   Recent Labs Lab 12/24/14 0306  NA 138  K 3.2*  CL 104  CO2 24  BUN 8  CREATININE 0.82  CALCIUM 8.7*  GLUCOSE 109*   Protein Profile:   Recent Labs Lab 12/24/14 0306  ALBUMIN 3.6    Gastrointestinal Profile: Last BM: 1030   Nutrition-Focused Physical Exam Findings:    Weight Change: 1% weight loss in the last 2 months    Diet Order:  DIET SOFT Room service appropriate?: Yes; Fluid consistency:: Thin  Skin:   reviewed   Height:   Ht Readings from Last 1 Encounters:  12/24/14 5' (1.524 m)    Weight:    Wt Readings from Last 1 Encounters:  12/24/14 220 lb (99.791 kg)     BMI:  Body mass index is 42.97 kg/(m^2).   EDUCATION NEEDS:   No education needs identified at this time  LOW Care Level  Martha Miranda Martha Miranda, Martha Miranda, Martha Miranda (pager)

## 2014-12-25 NOTE — Progress Notes (Signed)
1 Day Post-Op  Subjective: Postop lap appendectomy for what appears to be mesenteric adenitis. She feels better but still has some back pain and is considerably nauseated but has not vomited.  Objective: Vital signs in last 24 hours: Temp:  [97.9 F (36.6 C)-99.1 F (37.3 C)] 98.5 F (36.9 C) (10/31 0502) Pulse Rate:  [58-93] 59 (10/31 0502) Resp:  [15-19] 17 (10/31 0502) BP: (106-135)/(53-73) 117/62 mmHg (10/31 0502) SpO2:  [91 %-100 %] 94 % (10/31 0502) FiO2 (%):  [21 %] 21 % (10/30 1734) Last BM Date: 12/24/14  Intake/Output from previous day: 10/30 0701 - 10/31 0700 In: 2506.1 [I.V.:2506.1] Out: 1500 [Urine:1500] Intake/Output this shift: Total I/O In: 850 [P.O.:850] Out: 150 [Urine:150]  Physical exam:  Abdomen is soft and minimally tender wounds are clean with Dermabond in place calves are nontender.  Lab Results: CBC   Recent Labs  12/24/14 0306  WBC 10.1  9.8  HGB 11.4*  11.4*  HCT 34.4*  34.4*  PLT 315  311   BMET  Recent Labs  12/24/14 0306  NA 138  K 3.2*  CL 104  CO2 24  GLUCOSE 109*  BUN 8  CREATININE 0.82  CALCIUM 8.7*   PT/INR No results for input(s): LABPROT, INR in the last 72 hours. ABG No results for input(s): PHART, HCO3 in the last 72 hours.  Invalid input(s): PCO2, PO2  Studies/Results: Mr Abdomen W Wo Contrast  12/24/2014  CLINICAL DATA:  Four days of umbilical pain and RIGHT lower quadrant pain with nausea and vomiting. Fevers. History of cholecystectomy and RIGHT ovary removal EXAM: MRI ABDOMEN WITHOUT AND WITH CONTRAST TECHNIQUE: Multiplanar multisequence MR imaging of the abdomen was performed both before and after the administration of intravenous contrast. CONTRAST:  53mL MULTIHANCE GADOBENATE DIMEGLUMINE 529 MG/ML IV SOLN COMPARISON:  CT abdomen 12/12/2014 FINDINGS: Lower chest:  Lung bases clear Hepatobiliary: Normal liver echogenicity. Common bile duct is normal caliber. Post cholecystectomy. No hepatic steatosis. No  biliary duct dilatation. No enhancing lesion within the liver parenchyma. Pancreas: Normal pancreatic parenchymal intensity. No ductal dilatation or inflammation. Spleen: Normal spleen. Adrenals/urinary tract: Adrenal glands and kidneys are normal. Stomach/Bowel: Stomach and limited of the small bowel is unremarkable Vascular/Lymphatic: Abdominal aortic normal caliber. No retroperitoneal periportal lymphadenopathy. Musculoskeletal: No aggressive osseous lesion Other: Cystic lesion in the LEFT pelvis is partially imaged. See prior CT. IMPRESSION: 1. Normal liver post cholecystectomy.  Normal parenchymal intensity 2. Normal pancreas. 3. Normal biliary system. Electronically Signed   By: Suzy Bouchard M.D.   On: 12/24/2014 14:38   Dg Abd Acute W/chest  12/24/2014  CLINICAL DATA:  Acute onset of lower abdominal pain and nausea. Weight loss over the past 2 weeks. Weakness. Initial encounter. EXAM: DG ABDOMEN ACUTE W/ 1V CHEST COMPARISON:  CT of the abdomen and pelvis performed 12/12/2014 FINDINGS: The lungs are well-aerated and clear. There is no evidence of focal opacification, pleural effusion or pneumothorax. The cardiomediastinal silhouette is within normal limits. The visualized bowel gas pattern is unremarkable. Scattered stool and air are seen within the colon; there is no evidence of small bowel dilatation to suggest obstruction. No free intra-abdominal air is identified on the provided upright view. Clips are noted within the right upper quadrant, reflecting prior cholecystectomy. No acute osseous abnormalities are seen; the sacroiliac joints are unremarkable in appearance. IMPRESSION: 1. Unremarkable bowel gas pattern; no free intra-abdominal air seen. 2. No acute cardiopulmonary process seen. Electronically Signed   By: Garald Balding M.D.   On:  12/24/2014 07:07    Anti-infectives: Anti-infectives    Start     Dose/Rate Route Frequency Ordered Stop   12/25/14 0600  Ampicillin-Sulbactam (UNASYN) 3  g in sodium chloride 0.9 % 100 mL IVPB  Status:  Discontinued     3 g 100 mL/hr over 60 Minutes Intravenous On call to O.R. 12/24/14 1523 12/24/14 1842      Assessment/Plan: s/p Procedure(s): APPENDECTOMY LAPAROSCOPIC LAPAROSCOPY DIAGNOSTIC   Patient currently not ready to go home as she has considerable nausea but has not vomited I would not like to advance her diet to quickly. What was described at operation suggests mesenteric adenitis secondary to a presumed viral infection.  Florene Glen, MD, FACS  12/25/2014

## 2014-12-25 NOTE — Anesthesia Postprocedure Evaluation (Signed)
  Anesthesia Post-op Note  Patient: Museum/gallery conservator Martha Miranda  Procedure(s) Performed: Procedure(s): APPENDECTOMY LAPAROSCOPIC (N/A) LAPAROSCOPY DIAGNOSTIC  Anesthesia type:General  Patient location: PACU  Post pain: Pain level controlled  Post assessment: Post-op Vital signs reviewed, Patient's Cardiovascular Status Stable, Respiratory Function Stable, Patent Airway and No signs of Nausea or vomiting  Post vital signs: Reviewed and stable  Last Vitals:  Filed Vitals:   12/25/14 1231  BP: 132/69  Pulse: 50  Temp: 36.7 C  Resp:     Level of consciousness: awake, alert  and patient cooperative  Complications: No apparent anesthesia complications

## 2014-12-25 NOTE — Progress Notes (Signed)
Feels better with less pain and less nausea but did require Phenergan suppository for her nausea today she has had no vomiting. Will need to stay overnight at least to ensure that her nausea is improving.

## 2014-12-26 LAB — SURGICAL PATHOLOGY

## 2014-12-26 MED ORDER — PROMETHAZINE HCL 25 MG RE SUPP: 25.0000 mg | Freq: Three times a day (TID) | RECTAL | Status: DC | PRN

## 2014-12-26 NOTE — Progress Notes (Signed)
2 Days Post-Op  Subjective: Is a resolved tolerating a diet minimal pain no fevers no chills.  Objective: Vital signs in last 24 hours: Temp:  [97.9 F (36.6 C)-98.3 F (36.8 C)] 97.9 F (36.6 C) (11/01 0539) Pulse Rate:  [66-68] 68 (11/01 0539) Resp:  [18] 18 (11/01 0539) BP: (102-108)/(52-53) 102/53 mmHg (11/01 0539) SpO2:  [98 %-99 %] 99 % (11/01 0539) Last BM Date: 12/24/14  Intake/Output from previous day: 10/31 0701 - 11/01 0700 In: 4497 [P.O.:1250; I.V.:3247] Out: 2150 [Urine:2150] Intake/Output this shift: Total I/O In: 866 [P.O.:240; I.V.:626] Out: 600 [Urine:600]  Physical exam:  Abdomen is soft  nontender Wounds are clean calves are nontender. Lab Results: CBC   Recent Labs  12/24/14 0306  WBC 10.1  9.8  HGB 11.4*  11.4*  HCT 34.4*  34.4*  PLT 315  311   BMET  Recent Labs  12/24/14 0306  NA 138  K 3.2*  CL 104  CO2 24  GLUCOSE 109*  BUN 8  CREATININE 0.82  CALCIUM 8.7*   PT/INR No results for input(s): LABPROT, INR in the last 72 hours. ABG No results for input(s): PHART, HCO3 in the last 72 hours.  Invalid input(s): PCO2, PO2  Studies/Results: Mr Abdomen W Wo Contrast  12/24/2014  CLINICAL DATA:  Four days of umbilical pain and RIGHT lower quadrant pain with nausea and vomiting. Fevers. History of cholecystectomy and RIGHT ovary removal EXAM: MRI ABDOMEN WITHOUT AND WITH CONTRAST TECHNIQUE: Multiplanar multisequence MR imaging of the abdomen was performed both before and after the administration of intravenous contrast. CONTRAST:  72mL MULTIHANCE GADOBENATE DIMEGLUMINE 529 MG/ML IV SOLN COMPARISON:  CT abdomen 12/12/2014 FINDINGS: Lower chest:  Lung bases clear Hepatobiliary: Normal liver echogenicity. Common bile duct is normal caliber. Post cholecystectomy. No hepatic steatosis. No biliary duct dilatation. No enhancing lesion within the liver parenchyma. Pancreas: Normal pancreatic parenchymal intensity. No ductal dilatation or  inflammation. Spleen: Normal spleen. Adrenals/urinary tract: Adrenal glands and kidneys are normal. Stomach/Bowel: Stomach and limited of the small bowel is unremarkable Vascular/Lymphatic: Abdominal aortic normal caliber. No retroperitoneal periportal lymphadenopathy. Musculoskeletal: No aggressive osseous lesion Other: Cystic lesion in the LEFT pelvis is partially imaged. See prior CT. IMPRESSION: 1. Normal liver post cholecystectomy.  Normal parenchymal intensity 2. Normal pancreas. 3. Normal biliary system. Electronically Signed   By: Suzy Bouchard M.D.   On: 12/24/2014 14:38    Anti-infectives: Anti-infectives    Start     Dose/Rate Route Frequency Ordered Stop   12/25/14 0600  Ampicillin-Sulbactam (UNASYN) 3 g in sodium chloride 0.9 % 100 mL IVPB  Status:  Discontinued     3 g 100 mL/hr over 60 Minutes Intravenous On call to O.R. 12/24/14 1523 12/24/14 1842      Assessment/Plan: s/p Procedure(s): APPENDECTOMY LAPAROSCOPIC LAPAROSCOPY DIAGNOSTIC   Patient much improved and she wants to be discharged I agree with discharge at this point prescriptions of been written and sent she will follow-up in our office in a few days. Ductions are given.  Florene Glen, MD, FACS  12/26/2014

## 2014-12-26 NOTE — Discharge Instructions (Signed)
May shower Regular diet no heavy lifting Follow-up in 10 days.

## 2014-12-26 NOTE — Progress Notes (Signed)
12/26/2014 2:21 PM  BP 102/53 mmHg  Pulse 68  Temp(Src) 97.9 F (36.6 C) (Oral)  Resp 18  Ht 5' (1.524 m)  Wt 99.791 kg (220 lb)  BMI 42.97 kg/m2  SpO2 99%  LMP 12/12/2014 Patient discharged per MD orders. Discharge instructions reviewed with patient and patient verbalized understanding. IV removed per policy. Prescriptions discussed and given to patient. Discharged via wheelchair escorted by auxilary.  Almedia Balls, RN

## 2014-12-28 ENCOUNTER — Telehealth: Payer: Self-pay

## 2014-12-28 NOTE — Telephone Encounter (Signed)
Followed up with Martha Miranda since having surgery and she says she is doing extremely well.  Plans to return to work no later than Monday and followed up with [facility].  Encouraged her to contact PCP as needed.

## 2014-12-29 ENCOUNTER — Encounter: Payer: Self-pay | Admitting: General Surgery

## 2014-12-29 ENCOUNTER — Ambulatory Visit (INDEPENDENT_AMBULATORY_CARE_PROVIDER_SITE_OTHER): Payer: 59 | Admitting: General Surgery

## 2014-12-29 VITALS — BP 122/81 | HR 83 | Temp 98.2°F | Ht 60.0 in | Wt 214.0 lb

## 2014-12-29 DIAGNOSIS — Z4889 Encounter for other specified surgical aftercare: Secondary | ICD-10-CM | POA: Insufficient documentation

## 2014-12-29 NOTE — Patient Instructions (Addendum)
Please take it easy at work and if you feel bad, you can go home. I'm happy that you are here with [place]!!! Please give [place] a call if you have any questions or concerns.

## 2014-12-29 NOTE — Progress Notes (Signed)
Surgical Consultation  12/29/2014  Martha Miranda is an 34 y.o. female.   Chief Complaint  Patient presents with  . Routine Post Op    Laparoscopic Appendectomy 12/24/2014 Dr. Adonis Huguenin     HPI: Patient returns to clinic 5 days s/p laparoscopic appendectomy. Patient states she feels the best she has felt in a month. Only complaint is of fatigue and some back pain. Eating well and having normal bowel function. Only using ibuprofen for pain and otherwise doing well. She denies any fevers, chills, nausea, vomiting, diarrhea, constipation.  Past Medical History  Diagnosis Date  . Allergy   . GERD (gastroesophageal reflux disease)   . Chicken pox   . Depression   . Hypoglycemia   . Urinary incontinence   . UTI (urinary tract infection)   . Hydronephrosis   . Polycystic ovarian syndrome   . Genital warts     Past Surgical History  Procedure Laterality Date  . Utetral stent placement w/ removal    . 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 Cholecystectomy  2011  . Partial oophorectomy      removed 1/2 of right ovary  . Right oophorectomy  05/19/2013    & bitaleral tubal removal  . Tonsillectomy and adenoidectomy  1988  . Laparoscopic appendectomy N/A 12/24/2014    Procedure: APPENDECTOMY LAPAROSCOPIC;  Surgeon: Clayburn Pert, MD;  Location: [facility] ORS;  Service: General;  Laterality: N/A;  . Laparoscopy  12/24/2014    Procedure: LAPAROSCOPY DIAGNOSTIC;  Surgeon: Clayburn Pert, MD;  Location: [facility] ORS;  Service: General;;    Family History  Problem Relation Age of Onset  . Arthritis Mother   . Mental illness Mother   . Stroke Father   . High blood pressure Father   . Mental illness Father   . Diabetes Father   . Breast cancer Maternal Aunt   . Arthritis Maternal Grandmother   . High blood pressure Maternal Grandmother   . Diabetes Maternal Grandmother   . Dementia Maternal Grandmother   . High blood pressure Maternal Grandfather   . Diabetes Maternal  Grandfather   . Arthritis Paternal Grandmother   . High blood pressure Paternal Grandmother   . Kidney disease Paternal Grandmother   . Diabetes Paternal Grandmother   . Mental illness Paternal Grandfather     Social History:  reports that she has never smoked. She has never used smokeless tobacco. She reports that she does not drink alcohol or use illicit drugs.  Allergies:  Allergies  Allergen Reactions  . Percocet [Oxycodone-Acetaminophen] Nausea And Vomiting  . Chlorhexidine Rash    Medications reviewed.     ROS  A mutli-point ROS was completed. All pertinent positives and negatives were documented in the HPI, the remainder were negative.   BP 122/81 mmHg  Pulse 83  Temp(Src) 98.2 F (36.8 C) (Oral)  Ht 5' (1.524 m)  Wt 97.07 kg (214 lb)  BMI 41.79 kg/m2  LMP 12/12/2014  Physical Exam  Gen.: No acute distress Chest: Clear to auscultation Heart: Regular rhythm Abdomen: Soft, nontender, nondistended. Well proximal a lap scopic appendectomy incisions without any erythema or drainage.  Pathology reviewed showing appendicitis No results found for this or any previous visit (from the past 12 hour(s)). No results found.  Assessment/Plan: 1. Aftercare following surgery 34 year old female postop day 5 status post lap scopic appendectomy. Patient doing well. She desires return to work. Discussed signs and symptoms of infection of her wounds and seek follow-up with me  should they occur. Patient voiced understanding. Follow-up when necessary   Clayburn Pert dermatitis

## 2015-01-01 NOTE — Discharge Summary (Signed)
Patient ID: Martha Miranda MRN: [id number] DOB/AGE: [birth year] 34 y.o.  Admit date: 12/24/2014 Discharge date: 12/26/2014  Discharge Diagnoses:  Abdominal pain  Procedures Performed: Diagnostic Laparoscopy with appendectomy  Discharged Condition: good  Hospital Course: Admitted from ER with worsening abdominal pain for over 2 weeks. Taken to OR for diagnostic laparoscopy with appendectomy. Pain improved after surgery and able to be discharged home POD#2.  Discharge Orders: Discharge home, f/u this week.  Disposition: 01-Home or Self Care  Discharge Medications: No current facility-administered medications for this encounter.  Current outpatient prescriptions:  .  Norgestimate-Ethinyl Estradiol Triphasic (ORTHO TRI-CYCLEN, 28,) 0.18/0.215/0.25 MG-35 MCG tablet, Take 1 tablet by mouth daily., Disp: , Rfl:  .  ondansetron (ZOFRAN-ODT) 4 MG disintegrating tablet, Take 1 tablet (4 mg total) by mouth every 6 (six) hours as needed for nausea., Disp: 30 tablet, Rfl: 1 .  pantoprazole (PROTONIX) 40 MG tablet, Take 40 mg by mouth daily., Disp: , Rfl:   Follwup: Follow-up Information    Follow up with Clayburn Pert, MD In 10 days.   Specialty:  General Surgery   Why:  For wound re-check   Contact information:   [address] [place] [address] [phone]       Signed: Clayburn Pert 01/01/2015, 8:17 AM

## 2015-04-08 DIAGNOSIS — R1084 Generalized abdominal pain: Secondary | ICD-10-CM | POA: Insufficient documentation

## 2015-05-03 ENCOUNTER — Encounter: Payer: Self-pay | Admitting: Obstetrics and Gynecology

## 2015-06-13 ENCOUNTER — Encounter: Payer: Self-pay | Admitting: Obstetrics and Gynecology

## 2015-06-26 ENCOUNTER — Ambulatory Visit (INDEPENDENT_AMBULATORY_CARE_PROVIDER_SITE_OTHER): Payer: 59 | Admitting: Obstetrics and Gynecology

## 2015-06-26 ENCOUNTER — Encounter: Payer: Self-pay | Admitting: Obstetrics and Gynecology

## 2015-06-26 VITALS — BP 116/74 | HR 88 | Ht 60.0 in | Wt 224.9 lb

## 2015-06-26 DIAGNOSIS — N898 Other specified noninflammatory disorders of vagina: Secondary | ICD-10-CM

## 2015-06-26 DIAGNOSIS — Z308 Encounter for other contraceptive management: Secondary | ICD-10-CM | POA: Diagnosis not present

## 2015-06-26 DIAGNOSIS — Z01419 Encounter for gynecological examination (general) (routine) without abnormal findings: Secondary | ICD-10-CM | POA: Diagnosis not present

## 2015-06-26 DIAGNOSIS — L298 Other pruritus: Secondary | ICD-10-CM

## 2015-06-26 DIAGNOSIS — Z124 Encounter for screening for malignant neoplasm of cervix: Secondary | ICD-10-CM

## 2015-06-26 MED ORDER — NORGESTIMATE-ETH ESTRADIOL 0.25-35 MG-MCG PO TABS: 1.0000 | ORAL_TABLET | Freq: Every day | ORAL | Status: DC

## 2015-06-26 NOTE — Progress Notes (Signed)
GYNECOLOGY ANNUAL PHYSICAL EXAM PROGRESS NOTE  Subjective:    Martha Miranda is a 35 y.o. G31P1102 married female who presents for an annual exam.  The patient is sexually active. The patient wears seatbelts: yes. The patient participates in regular exercise: no. Has the patient ever been transfused or tattooed?: no. The patient reports that there is not domestic violence in her life.   The patient has the following complaints today:  1) Vaginal itching (internal) during the second week of OCP pill pack.  No associated vaginal discharge.  Itching resolves by third week of pack.  Has tried Monistat with no relief. Has been ongoing for several months.   Gynecologic History Patient's last menstrual period was 06/14/2015. Menarche age: 76 Contraception: OCP (estrogen/progesterone) History of STI's: Denies Last Pap: 04/2012. Results were: normal.  Denies h/o abnormal pap smears.    Obstetric History   G2   P2   T1   P1   A0   TAB0   SAB0   E0   M0   L2     # Outcome Date GA Lbr Len/2nd Weight Sex Delivery Anes PTL Lv  2 Term 2016 [redacted]w[redacted]d  7 lb 6 oz (3.345 kg) F CS-LTranv   Y  1 Preterm 2011 [redacted]w[redacted]d  6 lb 15 oz (3.147 kg) F Vag-Spont   Y     Complications: Hx of maternal laceration, 4th degree, currently pregnant      Past Medical History  Diagnosis Date  . Allergy   . GERD (gastroesophageal reflux disease)   . Chicken pox   . Depression   . Hypoglycemia   . Urinary incontinence   . UTI (urinary tract infection)   . Hydronephrosis   . Polycystic ovarian syndrome   . Genital warts   . CPD (cephalo-pelvic disproportion)   . Anemia   . Dysmenorrhea     Past Surgical History  Procedure Laterality Date  . Utetral stent placement w/ removal    . 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 Cholecystectomy  2011  . Partial oophorectomy      removed 1/2 of right ovary  . Right oophorectomy  05/19/2013    & bitaleral tubal removal  . Tonsillectomy and adenoidectomy  1988   . Laparoscopic appendectomy N/A 12/24/2014    Procedure: APPENDECTOMY LAPAROSCOPIC;  Surgeon: Clayburn Pert, MD;  Location: [facility] ORS;  Service: General;  Laterality: N/A;  . Laparoscopy  12/24/2014    Procedure: LAPAROSCOPY DIAGNOSTIC;  Surgeon: Clayburn Pert, MD;  Location: [facility] ORS;  Service: General;;  . Cesarean section      Family History  Problem Relation Age of Onset  . Arthritis Mother   . Mental illness Mother   . Stroke Father   . High blood pressure Father   . Mental illness Father   . Diabetes Father   . Deep vein thrombosis Father   . Pulmonary embolism Father   . Heart attack Father   . Breast cancer Maternal Aunt   . Arthritis Maternal Grandmother   . High blood pressure Maternal Grandmother   . Diabetes Maternal Grandmother   . Dementia Maternal Grandmother   . High blood pressure Maternal Grandfather   . Diabetes Maternal Grandfather   . Arthritis Paternal Grandmother   . High blood pressure Paternal Grandmother   . Kidney disease Paternal Grandmother   . Diabetes Paternal Grandmother   . Mental illness Paternal Grandfather   . Breast cancer Paternal Aunt  Social History   Social History  . Marital Status: Married    Spouse Name: N/A  . Number of Children: 1  . Years of Education: N/A   Occupational History  . Not on file.   Social History Main Topics  . Smoking status: Never Smoker   . Smokeless tobacco: Never Used  . Alcohol Use: No     Comment: rare  . Drug Use: No  . Sexual Activity: Yes    Birth Control/ Protection: Pill   Other Topics Concern  . Not on file   Social History Narrative    Current Outpatient Prescriptions on File Prior to Visit  Medication Sig Dispense Refill  . Norgestimate-Ethinyl Estradiol Triphasic (ORTHO TRI-CYCLEN, 28,) 0.18/0.215/0.25 MG-35 MCG tablet Take 1 tablet by mouth daily.    . pantoprazole (PROTONIX) 40 MG tablet Take 40 mg by mouth daily.     No current facility-administered medications on  file prior to visit.    Allergies  Allergen Reactions  . Percocet [Oxycodone-Acetaminophen] Nausea And Vomiting  . Chlorhexidine Rash     Review of Systems Constitutional: negative for chills, fatigue, fevers and sweats Eyes: negative for irritation, redness and visual disturbance Ears, nose, mouth, throat, and face: negative for hearing loss, nasal congestion, snoring and tinnitus Respiratory: negative for asthma, cough, sputum Cardiovascular: negative for chest pain, dyspnea, exertional chest pressure/discomfort, irregular heart beat, palpitations and syncope Gastrointestinal: negative for abdominal pain, change in bowel habits, nausea and vomiting Genitourinary: positive for vaginal itching during 2nd week of OCP pill pack.  Negative for abnormal menstrual periods, genital lesions, sexual problems and vaginal discharge, dysuria and urinary incontinence Integument/breast: negative for breast lump, breast tenderness and nipple discharge Hematologic/lymphatic: negative for bleeding and easy bruising Musculoskeletal:negative for back pain and muscle weakness Neurological: negative for dizziness, headaches, vertigo and weakness Endocrine: negative for diabetic symptoms including polydipsia, polyuria and skin dryness Allergic/Immunologic: negative for hay fever and urticaria       Objective:  Blood pressure 116/74, pulse 88, height 5' (1.524 m), weight 224 lb 14.4 oz (102.014 kg), last menstrual period 06/14/2015. Body mass index is 43.92 kg/(m^2).  General Appearance:    Alert, cooperative, no distress, appears stated age, morbidly obese  Head:    Normocephalic, without obvious abnormality, atraumatic  Eyes:    PERRL, conjunctiva/corneas clear, EOM's intact, both eyes  Ears:    Normal external ear canals, both ears  Nose:   Nares normal, septum midline, mucosa normal, no drainage or sinus tenderness  Throat:   Lips, mucosa, and tongue normal; teeth and gums normal  Neck:   Supple,  symmetrical, trachea midline, no adenopathy; thyroid: no enlargement/tenderness/nodules; no carotid bruit or JVD  Back:     Symmetric, no curvature, ROM normal, no CVA tenderness  Lungs:     Clear to auscultation bilaterally, respirations unlabored  Chest Wall:    No tenderness or deformity   Heart:    Regular rate and rhythm, S1 and S2 normal, no murmur, rub or gallop  Breast Exam:    No tenderness, masses, or nipple abnormality  Abdomen:     Soft, non-tender, bowel sounds active all four quadrants, no masses, no organomegaly.    Genitalia:    Pelvic:external genitalia normal, vagina without lesions, discharge, or tenderness, rectovaginal septum  normal. Cervix normal in appearance, no cervical motion tenderness, no adnexal masses or tenderness.  Uterus normal size, shape, mobile, regular contours, nontender.  Rectal:    Normal external sphincter.  No hemorrhoids appreciated. Internal  exam not done.   Extremities:   Extremities normal, atraumatic, no cyanosis or edema  Pulses:   2+ and symmetric all extremities  Skin:   Skin color, texture, turgor normal, no rashes or lesions  Lymph nodes:   Cervical, supraclavicular, and axillary nodes normal  Neurologic:   CNII-XII intact, normal strength, sensation and reflexes throughout   .  Labs:  Lab Results  Component Value Date   WBC 9.8 12/24/2014   WBC 10.1 12/24/2014   HGB 11.4* 12/24/2014   HGB 11.4* 12/24/2014   HCT 34.4* 12/24/2014   HCT 34.4* 12/24/2014   MCV 76.3* 12/24/2014   MCV 76.0* 12/24/2014   PLT 311 12/24/2014   PLT 315 12/24/2014    Lab Results  Component Value Date   CREATININE 0.82 12/24/2014   BUN 8 12/24/2014   NA 138 12/24/2014   K 3.2* 12/24/2014   CL 104 12/24/2014   CO2 24 12/24/2014    Lab Results  Component Value Date   ALT 20 12/24/2014   AST 23 12/24/2014   ALKPHOS 89 12/24/2014   BILITOT 0.3 12/24/2014     Lab Results  Component Value Date   TSH 0.49 10/23/2014     Assessment:     Healthy female exam.   Obesity Class III Vaginal itching   Plan:    Breast self exam technique reviewed and patient encouraged to perform self-exam monthly. Contraception: OCP (estrogen/progesterone).  Patient notes that the episodes during the second week of pill pack also occurred with the last OCPs used (prior to her last pregnancy, was also a triphasic pill).  Discussed trying a monophasic pill, or taking an antihistamine during 2nd week of pill pack.  Patient desires to try monophasic pill.  Will prescribe Ortho-Cyclen.  To begin after completion of current pack. Discussed healthy lifestyle modifications, including dietary and lifestyle changes. Pap smear performed today.   Follow up in 1 year.     Rubie Maid, MD [facility]

## 2015-06-26 NOTE — Patient Instructions (Signed)
Preventive Care for Adults, Female A healthy lifestyle and preventive care can promote health and wellness. Preventive health guidelines for women include the following key practices.  A routine yearly physical is a good way to check with your health care provider about your health and preventive screening. It is a chance to share any concerns and updates on your health and to receive a thorough exam.  Visit your dentist for a routine exam and preventive care every 6 months. Brush your teeth twice a day and floss once a day. Good oral hygiene prevents tooth decay and gum disease.  The frequency of eye exams is based on your age, health, family medical history, use of contact lenses, and other factors. Follow your health care provider's recommendations for frequency of eye exams.  Eat a healthy diet. Foods like vegetables, fruits, whole grains, low-fat dairy products, and lean protein foods contain the nutrients you need without too many calories. Decrease your intake of foods high in solid fats, added sugars, and salt. Eat the right amount of calories for you.Get information about a proper diet from your health care provider, if necessary.  Regular physical exercise is one of the most important things you can do for your health. Most adults should get at least 150 minutes of moderate-intensity exercise (any activity that increases your heart rate and causes you to sweat) each week. In addition, most adults need muscle-strengthening exercises on 2 or more days a week.  Maintain a healthy weight. The body mass index (BMI) is a screening tool to identify possible weight problems. It provides an estimate of body fat based on height and weight. Your health care provider can find your BMI and can help you achieve or maintain a healthy weight.For adults 20 years and older:  A BMI below 18.5 is considered underweight.  A BMI of 18.5 to 24.9 is normal.  A BMI of 25 to 29.9 is considered  overweight.  A BMI of 30 and above is considered obese.  Maintain normal blood lipids and cholesterol levels by exercising and minimizing your intake of saturated fat. Eat a balanced diet with plenty of fruit and vegetables. Blood tests for lipids and cholesterol should begin at age 64 and be repeated every 5 years. If your lipid or cholesterol levels are high, you are over 50, or you are at high risk for heart disease, you may need your cholesterol levels checked more frequently.Ongoing high lipid and cholesterol levels should be treated with medicines if diet and exercise are not working.  If you smoke, find out from your health care provider how to quit. If you do not use tobacco, do not start.  Lung cancer screening is recommended for adults aged 52-80 years who are at high risk for developing lung cancer because of a history of smoking. A yearly low-dose CT scan of the lungs is recommended for people who have at least a 30-pack-year history of smoking and are a current smoker or have quit within the past 15 years. A pack year of smoking is smoking an average of 1 pack of cigarettes a day for 1 year (for example: 1 pack a day for 30 years or 2 packs a day for 15 years). Yearly screening should continue until the smoker has stopped smoking for at least 15 years. Yearly screening should be stopped for people who develop a health problem that would prevent them from having lung cancer treatment.  If you are pregnant, do not drink alcohol. If you are  breastfeeding, be very cautious about drinking alcohol. If you are not pregnant and choose to drink alcohol, do not have more than 1 drink per day. One drink is considered to be 12 ounces (355 mL) of beer, 5 ounces (148 mL) of wine, or 1.5 ounces (44 mL) of liquor.  Avoid use of street drugs. Do not share needles with anyone. Ask for help if you need support or instructions about stopping the use of drugs.  High blood pressure causes heart disease and  increases the risk of stroke. Your blood pressure should be checked at least every 1 to 2 years. Ongoing high blood pressure should be treated with medicines if weight loss and exercise do not work.  If you are 25-78 years old, ask your health care provider if you should take aspirin to prevent strokes.  Diabetes screening is done by taking a blood sample to check your blood glucose level after you have not eaten for a certain period of time (fasting). If you are not overweight and you do not have risk factors for diabetes, you should be screened once every 3 years starting at age 86. If you are overweight or obese and you are 3-87 years of age, you should be screened for diabetes every year as part of your cardiovascular risk assessment.  Breast cancer screening is essential preventive care for women. You should practice "breast self-awareness." This means understanding the normal appearance and feel of your breasts and may include breast self-examination. Any changes detected, no matter how small, should be reported to a health care provider. Women in their 66s and 30s should have a clinical breast exam (CBE) by a health care provider as part of a regular health exam every 1 to 3 years. After age 43, women should have a CBE every year. Starting at age 37, women should consider having a mammogram (breast X-ray test) every year. Women who have a family history of breast cancer should talk to their health care provider about genetic screening. Women at a high risk of breast cancer should talk to their health care providers about having an MRI and a mammogram every year.  Breast cancer gene (BRCA)-related cancer risk assessment is recommended for women who have family members with BRCA-related cancers. BRCA-related cancers include breast, ovarian, tubal, and peritoneal cancers. Having family members with these cancers may be associated with an increased risk for harmful changes (mutations) in the breast  cancer genes BRCA1 and BRCA2. Results of the assessment will determine the need for genetic counseling and BRCA1 and BRCA2 testing.  Your health care provider may recommend that you be screened regularly for cancer of the pelvic organs (ovaries, uterus, and vagina). This screening involves a pelvic examination, including checking for microscopic changes to the surface of your cervix (Pap test). You may be encouraged to have this screening done every 3 years, beginning at age 78.  For women ages 79-65, health care providers may recommend pelvic exams and Pap testing every 3 years, or they may recommend the Pap and pelvic exam, combined with testing for human papilloma virus (HPV), every 5 years. Some types of HPV increase your risk of cervical cancer. Testing for HPV may also be done on women of any age with unclear Pap test results.  Other health care providers may not recommend any screening for nonpregnant women who are considered low risk for pelvic cancer and who do not have symptoms. Ask your health care provider if a screening pelvic exam is right for  you.  If you have had past treatment for cervical cancer or a condition that could lead to cancer, you need Pap tests and screening for cancer for at least 20 years after your treatment. If Pap tests have been discontinued, your risk factors (such as having a new sexual partner) need to be reassessed to determine if screening should resume. Some women have medical problems that increase the chance of getting cervical cancer. In these cases, your health care provider may recommend more frequent screening and Pap tests.  Colorectal cancer can be detected and often prevented. Most routine colorectal cancer screening begins at the age of 50 years and continues through age 75 years. However, your health care provider may recommend screening at an earlier age if you have risk factors for colon cancer. On a yearly basis, your health care provider may provide  home test kits to check for hidden blood in the stool. Use of a small camera at the end of a tube, to directly examine the colon (sigmoidoscopy or colonoscopy), can detect the earliest forms of colorectal cancer. Talk to your health care provider about this at age 50, when routine screening begins. Direct exam of the colon should be repeated every 5-10 years through age 75 years, unless early forms of precancerous polyps or small growths are found.  People who are at an increased risk for hepatitis B should be screened for this virus. You are considered at high risk for hepatitis B if:  You were born in a country where hepatitis B occurs often. Talk with your health care provider about which countries are considered high risk.  Your parents were born in a high-risk country and you have not received a shot to protect against hepatitis B (hepatitis B vaccine).  You have HIV or AIDS.  You use needles to inject street drugs.  You live with, or have sex with, someone who has hepatitis B.  You get hemodialysis treatment.  You take certain medicines for conditions like cancer, organ transplantation, and autoimmune conditions.  Hepatitis C blood testing is recommended for all people born from 1945 through 1965 and any individual with known risks for hepatitis C.  Practice safe sex. Use condoms and avoid high-risk sexual practices to reduce the spread of sexually transmitted infections (STIs). STIs include gonorrhea, chlamydia, syphilis, trichomonas, herpes, HPV, and human immunodeficiency virus (HIV). Herpes, HIV, and HPV are viral illnesses that have no cure. They can result in disability, cancer, and death.  You should be screened for sexually transmitted illnesses (STIs) including gonorrhea and chlamydia if:  You are sexually active and are younger than 24 years.  You are older than 24 years and your health care provider tells you that you are at risk for this type of infection.  Your sexual  activity has changed since you were last screened and you are at an increased risk for chlamydia or gonorrhea. Ask your health care provider if you are at risk.  If you are at risk of being infected with HIV, it is recommended that you take a prescription medicine daily to prevent HIV infection. This is called preexposure prophylaxis (PrEP). You are considered at risk if:  You are sexually active and do not regularly use condoms or know the HIV status of your partner(s).  You take drugs by injection.  You are sexually active with a partner who has HIV.  Talk with your health care provider about whether you are at high risk of being infected with HIV. If   you choose to begin PrEP, you should first be tested for HIV. You should then be tested every 3 months for as long as you are taking PrEP.  Osteoporosis is a disease in which the bones lose minerals and strength with aging. This can result in serious bone fractures or breaks. The risk of osteoporosis can be identified using a bone density scan. Women ages 1 years and over and women at risk for fractures or osteoporosis should discuss screening with their health care providers. Ask your health care provider whether you should take a calcium supplement or vitamin D to reduce the rate of osteoporosis.  Menopause can be associated with physical symptoms and risks. Hormone replacement therapy is available to decrease symptoms and risks. You should talk to your health care provider about whether hormone replacement therapy is right for you.  Use sunscreen. Apply sunscreen liberally and repeatedly throughout the day. You should seek shade when your shadow is shorter than you. Protect yourself by wearing long sleeves, pants, a wide-brimmed hat, and sunglasses year round, whenever you are outdoors.  Once a month, do a whole body skin exam, using a mirror to look at the skin on your back. Tell your health care provider of new moles, moles that have irregular  borders, moles that are larger than a pencil eraser, or moles that have changed in shape or color.  Stay current with required vaccines (immunizations).  Influenza vaccine. All adults should be immunized every year.  Tetanus, diphtheria, and acellular pertussis (Td, Tdap) vaccine. Pregnant women should receive 1 dose of Tdap vaccine during each pregnancy. The dose should be obtained regardless of the length of time since the last dose. Immunization is preferred during the 27th-36th week of gestation. An adult who has not previously received Tdap or who does not know her vaccine status should receive 1 dose of Tdap. This initial dose should be followed by tetanus and diphtheria toxoids (Td) booster doses every 10 years. Adults with an unknown or incomplete history of completing a 3-dose immunization series with Td-containing vaccines should begin or complete a primary immunization series including a Tdap dose. Adults should receive a Td booster every 10 years.  Varicella vaccine. An adult without evidence of immunity to varicella should receive 2 doses or a second dose if she has previously received 1 dose. Pregnant females who do not have evidence of immunity should receive the first dose after pregnancy. This first dose should be obtained before leaving the health care facility. The second dose should be obtained 4-8 weeks after the first dose.  Human papillomavirus (HPV) vaccine. Females aged 13-26 years who have not received the vaccine previously should obtain the 3-dose series. The vaccine is not recommended for use in pregnant females. However, pregnancy testing is not needed before receiving a dose. If a female is found to be pregnant after receiving a dose, no treatment is needed. In that case, the remaining doses should be delayed until after the pregnancy. Immunization is recommended for any person with an immunocompromised condition through the age of 24 years if she did not get any or all doses  earlier. During the 3-dose series, the second dose should be obtained 4-8 weeks after the first dose. The third dose should be obtained 24 weeks after the first dose and 16 weeks after the second dose.  Zoster vaccine. One dose is recommended for adults aged 97 years or older unless certain conditions are present.  Measles, mumps, and rubella (MMR) vaccine. Adults born  before 1957 generally are considered immune to measles and mumps. Adults born in [birth year] or later should have 1 or more doses of MMR vaccine unless there is a contraindication to the vaccine or there is laboratory evidence of immunity to each of the three diseases. A routine second dose of MMR vaccine should be obtained at least 28 days after the first dose for students attending postsecondary schools, health care workers, or international travelers. People who received inactivated measles vaccine or an unknown type of measles vaccine during 1963-1967 should receive 2 doses of MMR vaccine. People who received inactivated mumps vaccine or an unknown type of mumps vaccine before 1979 and are at high risk for mumps infection should consider immunization with 2 doses of MMR vaccine. For females of childbearing age, rubella immunity should be determined. If there is no evidence of immunity, females who are not pregnant should be vaccinated. If there is no evidence of immunity, females who are pregnant should delay immunization until after pregnancy. Unvaccinated health care workers born before 60 who lack laboratory evidence of measles, mumps, or rubella immunity or laboratory confirmation of disease should consider measles and mumps immunization with 2 doses of MMR vaccine or rubella immunization with 1 dose of MMR vaccine.  Pneumococcal 13-valent conjugate (PCV13) vaccine. When indicated, a person who is uncertain of his immunization history and has no record of immunization should receive the PCV13 vaccine. All adults 61 years of age and older  should receive this vaccine. An adult aged 92 years or older who has certain medical conditions and has not been previously immunized should receive 1 dose of PCV13 vaccine. This PCV13 should be followed with a dose of pneumococcal polysaccharide (PPSV23) vaccine. Adults who are at high risk for pneumococcal disease should obtain the PPSV23 vaccine at least 8 weeks after the dose of PCV13 vaccine. Adults older than 34 years of age who have normal immune system function should obtain the PPSV23 vaccine dose at least 1 year after the dose of PCV13 vaccine.  Pneumococcal polysaccharide (PPSV23) vaccine. When PCV13 is also indicated, PCV13 should be obtained first. All adults aged 2 years and older should be immunized. An adult younger than age 30 years who has certain medical conditions should be immunized. Any person who resides in a nursing home or long-term care facility should be immunized. An adult smoker should be immunized. People with an immunocompromised condition and certain other conditions should receive both PCV13 and PPSV23 vaccines. People with human immunodeficiency virus (HIV) infection should be immunized as soon as possible after diagnosis. Immunization during chemotherapy or radiation therapy should be avoided. Routine use of PPSV23 vaccine is not recommended for American Indians, [place] Natives, or people younger than 65 years unless there are medical conditions that require PPSV23 vaccine. When indicated, people who have unknown immunization and have no record of immunization should receive PPSV23 vaccine. One-time revaccination 5 years after the first dose of PPSV23 is recommended for people aged 19-64 years who have chronic kidney failure, nephrotic syndrome, asplenia, or immunocompromised conditions. People who received 1-2 doses of PPSV23 before age 44 years should receive another dose of PPSV23 vaccine at age 83 years or later if at least 5 years have passed since the previous dose. Doses  of PPSV23 are not needed for people immunized with PPSV23 at or after age 20 years.  Meningococcal vaccine. Adults with asplenia or persistent complement component deficiencies should receive 2 doses of quadrivalent meningococcal conjugate (MenACWY-D) vaccine. The doses should be obtained  at least 2 months apart. Microbiologists working with certain meningococcal bacteria, [place] recruits, people at risk during an outbreak, and people who travel to or live in countries with a high rate of meningitis should be immunized. A first-year college student up through age 28 years who is living in a residence hall should receive a dose if she did not receive a dose on or after her 16th birthday. Adults who have certain high-risk conditions should receive one or more doses of vaccine.  Hepatitis A vaccine. Adults who wish to be protected from this disease, have certain high-risk conditions, work with hepatitis A-infected animals, work in hepatitis A research labs, or travel to or work in countries with a high rate of hepatitis A should be immunized. Adults who were previously unvaccinated and who anticipate close contact with an international adoptee during the first 60 days after arrival in the [place] from a country with a high rate of hepatitis A should be immunized.  Hepatitis B vaccine. Adults who wish to be protected from this disease, have certain high-risk conditions, may be exposed to blood or other infectious body fluids, are household contacts or sex partners of hepatitis B positive people, are clients or workers in certain care facilities, or travel to or work in countries with a high rate of hepatitis B should be immunized.  Haemophilus influenzae type b (Hib) vaccine. A previously unvaccinated person with asplenia or sickle cell disease or having a scheduled splenectomy should receive 1 dose of Hib vaccine. Regardless of previous immunization, a recipient of a hematopoietic stem cell transplant  should receive a 3-dose series 6-12 months after her successful transplant. Hib vaccine is not recommended for adults with HIV infection. Preventive Services / Frequency Ages 71 to 87 years  Blood pressure check.** / Every 3-5 years.  Lipid and cholesterol check.** / Every 5 years beginning at age 1.  Clinical breast exam.** / Every 3 years for women in their 3s and 31s.  BRCA-related cancer risk assessment.** / For women who have family members with a BRCA-related cancer (breast, ovarian, tubal, or peritoneal cancers).  Pap test.** / Every 2 years from ages 50 through 86. Every 3 years starting at age 87 through age 7 or 75 with a history of 3 consecutive normal Pap tests.  HPV screening.** / Every 3 years from ages 59 through ages 35 to 6 with a history of 3 consecutive normal Pap tests.  Hepatitis C blood test.** / For any individual with known risks for hepatitis C.  Skin self-exam. / Monthly.  Influenza vaccine. / Every year.  Tetanus, diphtheria, and acellular pertussis (Tdap, Td) vaccine.** / Consult your health care provider. Pregnant women should receive 1 dose of Tdap vaccine during each pregnancy. 1 dose of Td every 10 years.  Varicella vaccine.** / Consult your health care provider. Pregnant females who do not have evidence of immunity should receive the first dose after pregnancy.  HPV vaccine. / 3 doses over 6 months, if 72 and younger. The vaccine is not recommended for use in pregnant females. However, pregnancy testing is not needed before receiving a dose.  Measles, mumps, rubella (MMR) vaccine.** / You need at least 1 dose of MMR if you were born in 1957 or later. You may also need a 2nd dose. For females of childbearing age, rubella immunity should be determined. If there is no evidence of immunity, females who are not pregnant should be vaccinated. If there is no evidence of immunity, females who are  pregnant should delay immunization until after  pregnancy.  Pneumococcal 13-valent conjugate (PCV13) vaccine.** / Consult your health care provider.  Pneumococcal polysaccharide (PPSV23) vaccine.** / 1 to 2 doses if you smoke cigarettes or if you have certain conditions.  Meningococcal vaccine.** / 1 dose if you are age 87 to 44 years and a Market researcher living in a residence hall, or have one of several medical conditions, you need to get vaccinated against meningococcal disease. You may also need additional booster doses.  Hepatitis A vaccine.** / Consult your health care provider.  Hepatitis B vaccine.** / Consult your health care provider.  Haemophilus influenzae type b (Hib) vaccine.** / Consult your health care provider. Ages 86 to 38 years  Blood pressure check.** / Every year.  Lipid and cholesterol check.** / Every 5 years beginning at age 49 years.  Lung cancer screening. / Every year if you are aged 71-80 years and have a 30-pack-year history of smoking and currently smoke or have quit within the past 15 years. Yearly screening is stopped once you have quit smoking for at least 15 years or develop a health problem that would prevent you from having lung cancer treatment.  Clinical breast exam.** / Every year after age 51 years.  BRCA-related cancer risk assessment.** / For women who have family members with a BRCA-related cancer (breast, ovarian, tubal, or peritoneal cancers).  Mammogram.** / Every year beginning at age 18 years and continuing for as long as you are in good health. Consult with your health care provider.  Pap test.** / Every 3 years starting at age 63 years through age 37 or 57 years with a history of 3 consecutive normal Pap tests.  HPV screening.** / Every 3 years from ages 41 years through ages 76 to 23 years with a history of 3 consecutive normal Pap tests.  Fecal occult blood test (FOBT) of stool. / Every year beginning at age 36 years and continuing until age 51 years. You may not need  to do this test if you get a colonoscopy every 10 years.  Flexible sigmoidoscopy or colonoscopy.** / Every 5 years for a flexible sigmoidoscopy or every 10 years for a colonoscopy beginning at age 36 years and continuing until age 35 years.  Hepatitis C blood test.** / For all people born from [birth year] through 1965 and any individual with known risks for hepatitis C.  Skin self-exam. / Monthly.  Influenza vaccine. / Every year.  Tetanus, diphtheria, and acellular pertussis (Tdap/Td) vaccine.** / Consult your health care provider. Pregnant women should receive 1 dose of Tdap vaccine during each pregnancy. 1 dose of Td every 10 years.  Varicella vaccine.** / Consult your health care provider. Pregnant females who do not have evidence of immunity should receive the first dose after pregnancy.  Zoster vaccine.** / 1 dose for adults aged 73 years or older.  Measles, mumps, rubella (MMR) vaccine.** / You need at least 1 dose of MMR if you were born in 1957 or later. You may also need a second dose. For females of childbearing age, rubella immunity should be determined. If there is no evidence of immunity, females who are not pregnant should be vaccinated. If there is no evidence of immunity, females who are pregnant should delay immunization until after pregnancy.  Pneumococcal 13-valent conjugate (PCV13) vaccine.** / Consult your health care provider.  Pneumococcal polysaccharide (PPSV23) vaccine.** / 1 to 2 doses if you smoke cigarettes or if you have certain conditions.  Meningococcal vaccine.** /  Consult your health care provider.  Hepatitis A vaccine.** / Consult your health care provider.  Hepatitis B vaccine.** / Consult your health care provider.  Haemophilus influenzae type b (Hib) vaccine.** / Consult your health care provider. Ages 80 years and over  Blood pressure check.** / Every year.  Lipid and cholesterol check.** / Every 5 years beginning at age 62 years.  Lung cancer  screening. / Every year if you are aged 32-80 years and have a 30-pack-year history of smoking and currently smoke or have quit within the past 15 years. Yearly screening is stopped once you have quit smoking for at least 15 years or develop a health problem that would prevent you from having lung cancer treatment.  Clinical breast exam.** / Every year after age 61 years.  BRCA-related cancer risk assessment.** / For women who have family members with a BRCA-related cancer (breast, ovarian, tubal, or peritoneal cancers).  Mammogram.** / Every year beginning at age 39 years and continuing for as long as you are in good health. Consult with your health care provider.  Pap test.** / Every 3 years starting at age 85 years through age 74 or 72 years with 3 consecutive normal Pap tests. Testing can be stopped between 65 and 70 years with 3 consecutive normal Pap tests and no abnormal Pap or HPV tests in the past 10 years.  HPV screening.** / Every 3 years from ages 55 years through ages 67 or 77 years with a history of 3 consecutive normal Pap tests. Testing can be stopped between 65 and 70 years with 3 consecutive normal Pap tests and no abnormal Pap or HPV tests in the past 10 years.  Fecal occult blood test (FOBT) of stool. / Every year beginning at age 81 years and continuing until age 22 years. You may not need to do this test if you get a colonoscopy every 10 years.  Flexible sigmoidoscopy or colonoscopy.** / Every 5 years for a flexible sigmoidoscopy or every 10 years for a colonoscopy beginning at age 67 years and continuing until age 22 years.  Hepatitis C blood test.** / For all people born from [birth year] through 1964 and any individual with known risks for hepatitis C.  Osteoporosis screening.** / A one-time screening for women ages 8 years and over and women at risk for fractures or osteoporosis.  Skin self-exam. / Monthly.  Influenza vaccine. / Every year.  Tetanus, diphtheria, and  acellular pertussis (Tdap/Td) vaccine.** / 1 dose of Td every 10 years.  Varicella vaccine.** / Consult your health care provider.  Zoster vaccine.** / 1 dose for adults aged 56 years or older.  Pneumococcal 13-valent conjugate (PCV13) vaccine.** / Consult your health care provider.  Pneumococcal polysaccharide (PPSV23) vaccine.** / 1 dose for all adults aged 15 years and older.  Meningococcal vaccine.** / Consult your health care provider.  Hepatitis A vaccine.** / Consult your health care provider.  Hepatitis B vaccine.** / Consult your health care provider.  Haemophilus influenzae type b (Hib) vaccine.** / Consult your health care provider. ** Family history and personal history of risk and conditions may change your health care provider's recommendations.   This information is not intended to replace advice given to you by your health care provider. Make sure you discuss any questions you have with your health care provider.   Document Released: 04/08/2001 Document Revised: 03/03/2014 Document Reviewed: 07/08/2010 Elsevier Interactive Patient Education [employer].

## 2015-07-02 LAB — PAP IG AND HPV HIGH-RISK
HPV, high-risk: POSITIVE — AB
PAP SMEAR COMMENT: 0

## 2015-08-07 DIAGNOSIS — K219 Gastro-esophageal reflux disease without esophagitis: Secondary | ICD-10-CM | POA: Diagnosis not present

## 2015-08-07 DIAGNOSIS — J382 Nodules of vocal cords: Secondary | ICD-10-CM | POA: Diagnosis not present

## 2015-08-07 DIAGNOSIS — R490 Dysphonia: Secondary | ICD-10-CM | POA: Diagnosis not present

## 2015-08-07 DIAGNOSIS — J301 Allergic rhinitis due to pollen: Secondary | ICD-10-CM | POA: Diagnosis not present

## 2015-11-27 ENCOUNTER — Other Ambulatory Visit: Payer: Self-pay

## 2015-11-27 ENCOUNTER — Other Ambulatory Visit: Payer: Self-pay | Admitting: Internal Medicine

## 2015-11-27 MED ORDER — PANTOPRAZOLE SODIUM 40 MG PO TBEC: 40.0000 mg | DELAYED_RELEASE_TABLET | Freq: Every day | ORAL | 0 refills | Status: DC

## 2015-11-27 NOTE — Progress Notes (Signed)
Patient states her obgyn prescribed it when she was pregnant but would like for [place] to take this over, will follow up next month with [place]

## 2015-11-27 NOTE — Progress Notes (Signed)
Please find out from pt who prescribed the medication.  I do not mind prescribing if she is planning to f/u with me regarding the medication.  Has appt with me next month.  Just let me know.

## 2015-11-27 NOTE — Progress Notes (Unsigned)
Patient would like a refill on this, I don't see that we prescribed this

## 2016-01-04 ENCOUNTER — Encounter: Payer: Self-pay | Admitting: Internal Medicine

## 2016-01-04 ENCOUNTER — Ambulatory Visit (INDEPENDENT_AMBULATORY_CARE_PROVIDER_SITE_OTHER): Payer: 59 | Admitting: Internal Medicine

## 2016-01-04 VITALS — BP 104/70 | HR 90 | Temp 98.2°F | Ht 60.0 in | Wt 225.0 lb

## 2016-01-04 DIAGNOSIS — Z13220 Encounter for screening for lipoid disorders: Secondary | ICD-10-CM

## 2016-01-04 DIAGNOSIS — D649 Anemia, unspecified: Secondary | ICD-10-CM | POA: Diagnosis not present

## 2016-01-04 DIAGNOSIS — Z862 Personal history of diseases of the blood and blood-forming organs and certain disorders involving the immune mechanism: Secondary | ICD-10-CM

## 2016-01-04 DIAGNOSIS — K2100 Gastro-esophageal reflux disease with esophagitis, without bleeding: Secondary | ICD-10-CM

## 2016-01-04 DIAGNOSIS — J019 Acute sinusitis, unspecified: Secondary | ICD-10-CM

## 2016-01-04 LAB — CBC WITH DIFFERENTIAL/PLATELET
BASOS ABS: 0 10*3/uL (ref 0.0–0.1)
BASOS PCT: 0.3 % (ref 0.0–3.0)
EOS ABS: 0.3 10*3/uL (ref 0.0–0.7)
Eosinophils Relative: 1.9 % (ref 0.0–5.0)
HCT: 37.8 % (ref 36.0–46.0)
Hemoglobin: 12.7 g/dL (ref 12.0–15.0)
LYMPHS ABS: 2.2 10*3/uL (ref 0.7–4.0)
LYMPHS PCT: 17.2 % (ref 12.0–46.0)
MCHC: 33.5 g/dL (ref 30.0–36.0)
MCV: 80.7 fl (ref 78.0–100.0)
MONO ABS: 0.9 10*3/uL (ref 0.1–1.0)
Monocytes Relative: 7.3 % (ref 3.0–12.0)
NEUTROS ABS: 9.5 10*3/uL — AB (ref 1.4–7.7)
NEUTROS PCT: 73.3 % (ref 43.0–77.0)
PLATELETS: 327 10*3/uL (ref 150.0–400.0)
RBC: 4.68 Mil/uL (ref 3.87–5.11)
RDW: 15 % (ref 11.5–15.5)
WBC: 12.9 10*3/uL — ABNORMAL HIGH (ref 4.0–10.5)

## 2016-01-04 LAB — COMPREHENSIVE METABOLIC PANEL
ALT: 15 U/L (ref 0–35)
AST: 17 U/L (ref 0–37)
Albumin: 4.4 g/dL (ref 3.5–5.2)
Alkaline Phosphatase: 103 U/L (ref 39–117)
BILIRUBIN TOTAL: 0.4 mg/dL (ref 0.2–1.2)
BUN: 12 mg/dL (ref 6–23)
CALCIUM: 9.4 mg/dL (ref 8.4–10.5)
CHLORIDE: 102 meq/L (ref 96–112)
CO2: 27 meq/L (ref 19–32)
CREATININE: 0.76 mg/dL (ref 0.40–1.20)
GFR: 92.25 mL/min (ref >60.00)
GLUCOSE: 81 mg/dL (ref 70–99)
Potassium: 4 mEq/L (ref 3.5–5.1)
SODIUM: 140 meq/L (ref 135–145)
Total Protein: 7.4 g/dL (ref 6.0–8.3)

## 2016-01-04 LAB — LIPID PANEL
CHOL/HDL RATIO: 3
CHOLESTEROL: 164 mg/dL (ref 0–200)
HDL: 51.9 mg/dL (ref >39.00)
LDL CALC: 87 mg/dL (ref 0–99)
NonHDL: 111.87
Triglycerides: 123 mg/dL (ref 0.0–149.0)
VLDL: 24.6 mg/dL (ref 0.0–40.0)

## 2016-01-04 LAB — TSH: TSH: 1.44 u[IU]/mL (ref 0.35–4.50)

## 2016-01-04 MED ORDER — PANTOPRAZOLE SODIUM 40 MG PO TBEC: 40.0000 mg | DELAYED_RELEASE_TABLET | Freq: Every day | ORAL | 5 refills | Status: DC

## 2016-01-04 MED ORDER — AMOXICILLIN 875 MG PO TABS: 875.0000 mg | ORAL_TABLET | Freq: Two times a day (BID) | ORAL | 0 refills | Status: DC

## 2016-01-04 NOTE — Patient Instructions (Signed)
Saline nasal spray - flush nose at least 2-3x/day  nasacort nasal spray - 2 sprays each nostril one time per day.  Do this in the evening.    mucinex in the am and robitussin in the pm.

## 2016-01-04 NOTE — Progress Notes (Signed)
Patient ID: Martha Miranda, female   DOB: [birth year], 35 y.o.   MRN: [id number]   Subjective:    Patient ID: Martha Miranda, female    DOB: November 03, 1980, 35 y.o.   MRN: [id number]  HPI  Patient here for a scheduled follow up.  She has been doing better.  Had her appendix removed last year.  Did well after this.  Reports increased head and chest congestion starting a few days ago.  Productive green/bloody mucus production.  Right ear feels full.  Previous sore throat.  Better now.  Some intermittent wheezing.  Some cough.  Has been taking otc cold meds and tylenol.  She also reports seeing ENT.  Had laryngoscopy.  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  Gave prescription for dymista and added ranitidine.  Did not tolerate this nose spray.  States nasacort works better.  Ranitidine worked better.  No nausea or vomiting.  Bowels doing well.  No abdominal pain.    Past Medical History:  Diagnosis Date  . Allergy   . Anemia   . Chicken pox   . CPD (cephalo-pelvic disproportion)   . Depression   . Dysmenorrhea   . Genital warts   . GERD (gastroesophageal reflux disease)   . Hydronephrosis   . Hypoglycemia   . Polycystic ovarian syndrome   . Urinary incontinence   . UTI (urinary tract infection)    Past Surgical History:  Procedure Laterality Date  . CESAREAN SECTION    . CHOLECYSTECTOMY  2011  . 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 LAPAROSCOPIC APPENDECTOMY N/A 12/24/2014   Procedure: APPENDECTOMY LAPAROSCOPIC;  Surgeon: Clayburn Pert, MD;  Location: [facility];  Service: General;  Laterality: N/A;  . LAPAROSCOPY  12/24/2014   Procedure: LAPAROSCOPY DIAGNOSTIC;  Surgeon: Clayburn Pert, MD;  Location: [facility] OR[facility];  Service: General;;  . partial oophorectomy     removed 1/2 of right ovary  . RIGHT OOPHORECTOMY  05/19/2013   & bitaleral tubal removal  . TONSILLECTOMY AND ADENOIDECTOMY  1988  . utetral stent placement w/ removal     Family History  Problem Relation Age of  Onset  . Arthritis Mother   . Mental illness Mother   . Stroke Father   . High blood pressure Father   . Mental illness Father   . Diabetes Father   . Deep vein thrombosis Father   . Pulmonary embolism Father   . Heart attack Father   . Breast cancer Maternal Aunt   . Arthritis Maternal Grandmother   . High blood pressure Maternal Grandmother   . Diabetes Maternal Grandmother   . Dementia Maternal Grandmother   . High blood pressure Maternal Grandfather   . Diabetes Maternal Grandfather   . Arthritis Paternal Grandmother   . High blood pressure Paternal Grandmother   . Kidney disease Paternal Grandmother   . Diabetes Paternal Grandmother   . Mental illness Paternal Grandfather   . Breast cancer Paternal Aunt    Social History   Social History  . Marital status: Married    Spouse name: N/A  . Number of children: 1  . Years of education: N/A   Social History Main Topics  . Smoking status: Never Smoker  . Smokeless tobacco: Never Used  . Alcohol use No     Comment: rare  . Drug use: No  . Sexual activity: Yes    Birth control/ protection: Pill   Other Topics Concern  . None   Social  History Narrative  . None    Outpatient Encounter Prescriptions as of 01/04/2016  Medication Sig  . norgestimate-ethinyl estradiol (ORTHO-CYCLEN, 28,) 0.25-35 MG-MCG tablet Take 1 tablet by mouth daily.  . pantoprazole (PROTONIX) 40 MG tablet Take 1 tablet (40 mg total) by mouth daily.  . [DISCONTINUED] pantoprazole (PROTONIX) 40 MG tablet Take 1 tablet (40 mg total) by mouth daily.  Marland Kitchen amoxicillin (AMOXIL) 875 MG tablet Take 1 tablet (875 mg total) by mouth 2 (two) times daily.   No facility-administered encounter medications on file as of 01/04/2016.     Review of Systems  Constitutional: Negative for appetite change and unexpected weight change.  HENT: Positive for congestion, postnasal drip and sinus pressure.        Ear fullness.    Respiratory: Positive for cough. Negative  for chest tightness and shortness of breath.   Cardiovascular: Negative for chest pain, palpitations and leg swelling.  Gastrointestinal: Negative for abdominal pain, diarrhea, nausea and vomiting.  Genitourinary: Negative for difficulty urinating and dysuria.  Musculoskeletal: Negative for back pain and joint swelling.  Skin: Negative for color change and rash.  Neurological: Negative for dizziness, light-headedness and headaches.  Psychiatric/Behavioral: Negative for agitation and dysphoric mood.       Objective:    Physical Exam  Constitutional: She appears well-developed and well-nourished. No distress.  HENT:  Mouth/Throat: Oropharynx is clear and moist.  Right TM - slight erythema.  Some tenderness to palpation over the maxillary and frontal sinus.  Nares - erythematous turbinates.    Neck: Neck supple. No thyromegaly present.  Cardiovascular: Normal rate and regular rhythm.   Pulmonary/Chest: Breath sounds normal. No respiratory distress. She has no wheezes.  Abdominal: Soft. Bowel sounds are normal. There is no tenderness.  Musculoskeletal: She exhibits no edema or tenderness.  Lymphadenopathy:    She has no cervical adenopathy.  Skin: No rash noted. No erythema.  Psychiatric: She has a normal mood and affect. Her behavior is normal.    BP 104/70   Pulse 90   Temp 98.2 F (36.8 C) (Oral)   Ht 5' (1.524 m)   Wt 225 lb (102.1 kg)   LMP 12/26/2015 (Exact Date)   SpO2 96%   BMI 43.94 kg/m  Wt Readings from Last 3 Encounters:  01/04/16 225 lb (102.1 kg)  06/26/15 224 lb 14.4 oz (102 kg)  12/29/14 214 lb (97.1 kg)     Lab Results  Component Value Date   WBC 12.9 (H) 01/04/2016   HGB 12.7 01/04/2016   HCT 37.8 01/04/2016   PLT 327.0 01/04/2016   GLUCOSE 81 01/04/2016   CHOL 164 01/04/2016   TRIG 123.0 01/04/2016   HDL 51.90 01/04/2016   LDLCALC 87 01/04/2016   ALT 15 01/04/2016   AST 17 01/04/2016   NA 140 01/04/2016   K 4.0 01/04/2016   CL 102 01/04/2016    CREATININE 0.76 01/04/2016   BUN 12 01/04/2016   CO2 27 01/04/2016   TSH 1.44 01/04/2016    Mr Abdomen W Wo Contrast  Result Date: 12/24/2014 CLINICAL DATA:  Four days of umbilical pain and RIGHT lower quadrant pain with nausea and vomiting. Fevers. History of cholecystectomy and RIGHT ovary removal EXAM: MRI ABDOMEN WITHOUT AND WITH CONTRAST TECHNIQUE: Multiplanar multisequence MR imaging of the abdomen was performed both before and after the administration of intravenous contrast. CONTRAST:  5mL MULTIHANCE GADOBENATE DIMEGLUMINE 529 MG/ML IV SOLN COMPARISON:  CT abdomen 12/12/2014 FINDINGS: Lower chest:  Lung bases clear Hepatobiliary: Normal  liver echogenicity. Common bile duct is normal caliber. Post cholecystectomy. No hepatic steatosis. No biliary duct dilatation. No enhancing lesion within the liver parenchyma. Pancreas: Normal pancreatic parenchymal intensity. No ductal dilatation or inflammation. Spleen: Normal spleen. Adrenals/urinary tract: Adrenal glands and kidneys are normal. Stomach/Bowel: Stomach and limited of the small bowel is unremarkable Vascular/Lymphatic: Abdominal aortic normal caliber. No retroperitoneal periportal lymphadenopathy. Musculoskeletal: No aggressive osseous lesion Other: Cystic lesion in the LEFT pelvis is partially imaged. See prior CT. IMPRESSION: 1. Normal liver post cholecystectomy.  Normal parenchymal intensity 2. Normal pancreas. 3. Normal biliary system. Electronically Signed   By: Suzy Bouchard M.D.   On: 12/24/2014 14:38   Dg Abd Acute W/chest  Result Date: 12/24/2014 CLINICAL DATA:  Acute onset of lower abdominal pain and nausea. Weight loss over the past 2 weeks. Weakness. Initial encounter. EXAM: DG ABDOMEN ACUTE W/ 1V CHEST COMPARISON:  CT of the abdomen and pelvis performed 12/12/2014 FINDINGS: The lungs are well-aerated and clear. There is no evidence of focal opacification, pleural effusion or pneumothorax. The cardiomediastinal silhouette is  within normal limits. The visualized bowel gas pattern is unremarkable. Scattered stool and air are seen within the colon; there is no evidence of small bowel dilatation to suggest obstruction. No free intra-abdominal air is identified on the provided upright view. Clips are noted within the right upper quadrant, reflecting prior cholecystectomy. No acute osseous abnormalities are seen; the sacroiliac joints are unremarkable in appearance. IMPRESSION: 1. Unremarkable bowel gas pattern; no free intra-abdominal air seen. 2. No acute cardiopulmonary process seen. Electronically Signed   By: Garald Balding M.D.   On: 12/24/2014 07:07       Assessment & Plan:   Problem List Items Addressed This Visit    Anemia    Previous documented history of anemia.  Recheck cbc today.        GERD (gastroesophageal reflux disease)    Taking protonix and ranitidine.  Improved.  Follow.  Evaluated by ENT.       Relevant Medications   pantoprazole (PROTONIX) 40 MG tablet   Sinusitis    Symptoms and exam as outlined.  Sinusitis with otitis media.  Treat with amoxicillin.  nasacort and saline nasal spray as directed.  mucinex and robitussin as directed.  Follow.       Relevant Medications   amoxicillin (AMOXIL) 875 MG tablet    Other Visit Diagnoses    History of anemia    -  Primary   Relevant Orders   CBC with Differential/Platelet (Completed)   Comprehensive metabolic panel (Completed)   TSH (Completed)   Screening cholesterol level       Relevant Orders   Lipid panel (Completed)       Einar Pheasant, MD

## 2016-01-04 NOTE — Progress Notes (Signed)
Pre visit review using our clinic review tool, if applicable. No additional management support is needed unless otherwise documented below in the visit note. 

## 2016-01-06 ENCOUNTER — Encounter: Payer: Self-pay | Admitting: Internal Medicine

## 2016-01-06 DIAGNOSIS — J329 Chronic sinusitis, unspecified: Secondary | ICD-10-CM | POA: Insufficient documentation

## 2016-01-06 NOTE — Assessment & Plan Note (Signed)
Symptoms and exam as outlined.  Sinusitis with otitis media.  Treat with amoxicillin.  nasacort and saline nasal spray as directed.  mucinex and robitussin as directed.  Follow.

## 2016-01-06 NOTE — Assessment & Plan Note (Signed)
Previous documented history of anemia.  Recheck cbc today.

## 2016-01-06 NOTE — Assessment & Plan Note (Signed)
Taking protonix and ranitidine.  Improved.  Follow.  Evaluated by ENT.

## 2016-01-07 ENCOUNTER — Telehealth: Payer: Self-pay | Admitting: *Deleted

## 2016-01-07 ENCOUNTER — Other Ambulatory Visit: Payer: Self-pay | Admitting: Internal Medicine

## 2016-01-07 DIAGNOSIS — D72829 Elevated white blood cell count, unspecified: Secondary | ICD-10-CM

## 2016-01-07 DIAGNOSIS — R739 Hyperglycemia, unspecified: Secondary | ICD-10-CM

## 2016-01-07 NOTE — Telephone Encounter (Signed)
Patient requested lab results Pt contact  [phone]

## 2016-01-07 NOTE — Progress Notes (Signed)
Order placed for f/u cbc.   

## 2016-01-07 NOTE — Progress Notes (Signed)
Order placed for a1c

## 2016-01-16 ENCOUNTER — Other Ambulatory Visit: Payer: Self-pay

## 2016-03-25 ENCOUNTER — Other Ambulatory Visit: Payer: Self-pay | Admitting: Cardiothoracic Surgery

## 2016-03-25 DIAGNOSIS — R6889 Other general symptoms and signs: Secondary | ICD-10-CM

## 2016-03-25 MED ORDER — OSELTAMIVIR PHOSPHATE 75 MG PO CAPS: 75.0000 mg | ORAL_CAPSULE | Freq: Every day | ORAL | 0 refills | Status: DC

## 2016-04-10 ENCOUNTER — Telehealth: Payer: Self-pay | Admitting: Internal Medicine

## 2016-04-10 NOTE — Telephone Encounter (Signed)
Pt called and stated that she is having some issues that she believes is stress induced. She is complaining memory issues, hair loss in clumps, chest tightness, crying outburst. She notices this when she is at work. Please advise, thank you!  Call pt @ [phone] (cell) [phone] (work)

## 2016-04-10 NOTE — Telephone Encounter (Signed)
Left voicemail on cell .  Left message at work number.

## 2016-04-11 NOTE — Telephone Encounter (Signed)
FYI Pt had a EKG and will have it faxed to the office

## 2016-04-11 NOTE — Telephone Encounter (Signed)
If ok to wait, we will have to find a spot to work her in.  If ok with this, please forward message to [place] and I will work with her to work her in.

## 2016-04-11 NOTE — Telephone Encounter (Signed)
Previously on antidepressant.  Thinks she may be having some anxiety issues.   Yesterday while at work her hands were shaking.   Patient denies suicidal thoughts more anger issues.  She is complaining of memory issues, hair loss, chest tightness, crying outburst.  Please advise .  Would like to see you.

## 2016-04-11 NOTE — Telephone Encounter (Signed)
Spoke with patient and advised of below.  She is willing to come in and see Joycelyn Schmid on Tuesday. No acute chest pain , however if worsens will go to ER for work up.

## 2016-04-11 NOTE — Telephone Encounter (Signed)
If having chest tightness, etc, I would prefer her to go ahead and be seen now and confirm nothing more acute going on and then I can f/u with her after.

## 2016-04-11 NOTE — Telephone Encounter (Addendum)
Patient had EKG done at her work in your results  folder for review. Thanks

## 2016-04-11 NOTE — Telephone Encounter (Signed)
Patient advised of below and states chest tightness is not acute issue, no family cardiac history. She can go down hall have pre-admit do EKG and fax it [place].  Doesn't want to pay co-pay.   When has chest tightness, have lump in throat hands shake and feel like anxiety.  Chest tightness is only transient only happens at work.  Please advise.

## 2016-04-11 NOTE — Telephone Encounter (Signed)
Reviewed EKG.  There are non specific changes noted.  I have no old EKGs for comparison.  See previous note.  If declines evaluation, then will need to be worked in.  I will be gone for part of next week.  We can try to work her in, but would she be willing to see someone else and then f/u with me if needs to be seen sooner.

## 2016-04-15 ENCOUNTER — Ambulatory Visit (INDEPENDENT_AMBULATORY_CARE_PROVIDER_SITE_OTHER): Payer: 59 | Admitting: Family

## 2016-04-15 ENCOUNTER — Encounter: Payer: Self-pay | Admitting: Family

## 2016-04-15 VITALS — BP 100/72 | HR 85 | Temp 98.2°F | Wt 227.6 lb

## 2016-04-15 DIAGNOSIS — F419 Anxiety disorder, unspecified: Principal | ICD-10-CM

## 2016-04-15 DIAGNOSIS — F418 Other specified anxiety disorders: Secondary | ICD-10-CM

## 2016-04-15 DIAGNOSIS — F329 Major depressive disorder, single episode, unspecified: Secondary | ICD-10-CM

## 2016-04-15 MED ORDER — SERTRALINE HCL 50 MG PO TABS: 50.0000 mg | ORAL_TABLET | Freq: Every day | ORAL | 0 refills | Status: DC

## 2016-04-15 NOTE — Assessment & Plan Note (Signed)
Patient and I agreed to start SSRI, Zoloft. Reassured by normal EKG and no CP, SOB. Close follow up. Encouraged follow up with EAP as she has done in the past. 6 week f/u

## 2016-04-15 NOTE — Progress Notes (Signed)
Pre visit review using our clinic review tool, if applicable. No additional management support is needed unless otherwise documented below in the visit note. 

## 2016-04-15 NOTE — Progress Notes (Signed)
Subjective:    Patient ID: Martha Miranda, female    DOB: 04-11-1980, 36 y.o.   MRN: [id number]  CC: MAQUEL PETROVICH is a 36 y.o. female who presents today for an acute visit.    HPI: Anxiety for past 2 months, worsening.  Having moments at work feels like breath is taken away ' as it at top of a roller coaster'. Noticed these episodes 'only happen at work.'  A lot of stress at work right now.  Feels like upper back muscles are 'just tense.' 'Overwhelmed.' Feels jittery and more irritable. Generally sleeping well. Has seen EAP counselor last year which helped.  No thoughts of hurting herself or anyone else.  No mania, financial indiscretion.   Called yesterday and tried to explain chest tightness, advised to go to ED. Declined as doesn't have any chest pain and thinks it is her anxiety. Work did an EKG for her.   No syncope.  Denies exertional chest pain or pressure, numbness or tingling radiating to left arm or jaw, palpitations, dizziness, frequent headaches, changes in vision, or shortness of breath.    H/o of depression couple of years ago; tried buspar and felt very dizzy. Had been wellbutrin, lexapro, celexa. Saw therapist several years ago.     HISTORY:  Past Medical History:  Diagnosis Date  . Allergy   . Anemia   . Chicken pox   . CPD (cephalo-pelvic disproportion)   . Depression   . Dysmenorrhea   . Genital warts   . GERD (gastroesophageal reflux disease)   . Hydronephrosis   . Hypoglycemia   . Polycystic ovarian syndrome   . Urinary incontinence   . UTI (urinary tract infection)    Past Surgical History:  Procedure Laterality Date  . CESAREAN SECTION    . CHOLECYSTECTOMY  2011  . 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 LAPAROSCOPIC APPENDECTOMY N/A 12/24/2014   Procedure: APPENDECTOMY LAPAROSCOPIC;  Surgeon: Clayburn Pert, MD;  Location: [facility];  Service: General;  Laterality: N/A;  . LAPAROSCOPY  12/24/2014   Procedure: LAPAROSCOPY DIAGNOSTIC;   Surgeon: Clayburn Pert, MD;  Location: [facility] OR[facility];  Service: General;;  . partial oophorectomy     removed 1/2 of right ovary  . RIGHT OOPHORECTOMY  05/19/2013   & bitaleral tubal removal  . TONSILLECTOMY AND ADENOIDECTOMY  1988  . utetral stent placement w/ removal     Family History  Problem Relation Age of Onset  . Arthritis Mother   . Mental illness Mother   . Stroke Father   . High blood pressure Father   . Mental illness Father   . Diabetes Father   . Deep vein thrombosis Father   . Pulmonary embolism Father   . Heart attack Father   . Breast cancer Maternal Aunt   . Arthritis Maternal Grandmother   . High blood pressure Maternal Grandmother   . Diabetes Maternal Grandmother   . Dementia Maternal Grandmother   . High blood pressure Maternal Grandfather   . Diabetes Maternal Grandfather   . Arthritis Paternal Grandmother   . High blood pressure Paternal Grandmother   . Kidney disease Paternal Grandmother   . Diabetes Paternal Grandmother   . Mental illness Paternal Grandfather   . Breast cancer Paternal Aunt     Allergies: Percocet [oxycodone-acetaminophen] and Chlorhexidine Current Outpatient Prescriptions on File Prior to Visit  Medication Sig Dispense Refill  . norgestimate-ethinyl estradiol (ORTHO-CYCLEN, 28,) 0.25-35 MG-MCG tablet Take 1 tablet by mouth  daily. 1 Package 11  . pantoprazole (PROTONIX) 40 MG tablet Take 1 tablet (40 mg total) by mouth daily. 30 tablet 5   No current facility-administered medications on file prior to visit.     Social History  Substance Use Topics  . Smoking status: Never Smoker  . Smokeless tobacco: Never Used  . Alcohol use No     Comment: rare    Review of Systems  Constitutional: Negative for chills and fever.  Eyes: Negative for visual disturbance.  Respiratory: Negative for cough, shortness of breath and wheezing.   Cardiovascular: Negative for chest pain and palpitations.  Gastrointestinal: Negative for nausea and  vomiting.  Neurological: Negative for headaches.  Psychiatric/Behavioral: Negative for dysphoric mood, sleep disturbance and suicidal ideas. The patient is nervous/anxious.       Objective:    BP 100/72 (BP Location: Left Arm, Patient Position: Sitting, Cuff Size: Large)   Pulse 85   Temp 98.2 F (36.8 C) (Oral)   Wt 227 lb 9.6 oz (103.2 kg)   SpO2 98%   BMI 44.45 kg/m    Physical Exam  Constitutional: She appears well-developed and well-nourished.  Eyes: Conjunctivae are normal.  Cardiovascular: Normal rate, regular rhythm, normal heart sounds and normal pulses.   Pulmonary/Chest: Effort normal and breath sounds normal. She has no wheezes. She has no rhonchi. She has no rales.  Neurological: She is alert.  Skin: Skin is warm and dry.  Psychiatric: She has a normal mood and affect. Her speech is normal and behavior is normal. Thought content normal.  Vitals reviewed.      Assessment & Plan:   Problem List Items Addressed This Visit      Other   Anxiety and depression - Primary    Patient and I agreed to start SSRI, Zoloft. Reassured by normal EKG and no CP, SOB. Close follow up. Encouraged follow up with EAP as she has done in the past. 6 week f/u      Relevant Medications   sertraline (ZOLOFT) 50 MG tablet   Other Relevant Orders   EKG 12-Lead (Completed)        I have discontinued Ms. Dicioccio's amoxicillin and oseltamivir. I am also having her start on sertraline. Additionally, I am having her maintain her norgestimate-ethinyl estradiol and pantoprazole.   Meds ordered this encounter  Medications  . sertraline (ZOLOFT) 50 MG tablet    Sig: Take 1 tablet (50 mg total) by mouth at bedtime.    Dispense:  90 tablet    Refill:  0    Order Specific Question:   Supervising Provider    Answer:   Crecencio Mc [[id number]]    Return precautions given.   Risks, benefits, and alternatives of the medications and treatment plan prescribed today were discussed, and  patient expressed understanding.   Education regarding symptom management and diagnosis given to patient on AVS.  Continue to follow with Einar Pheasant, MD for routine health maintenance.   Martha Miranda and I agreed with plan.   Mable Paris, FNP

## 2016-04-15 NOTE — Patient Instructions (Signed)
Our hope is for gradual improvement of mood since starting medication; however this may take several weeks.   If you start to have unusual thoughts, thoughts of hurting yourself, or anyone else, please go immediately to the emergency department.   Follow up 6 weeks   [place] - available 24 hours a day, 7 days a week.  [phone]  Major Depressive Disorder Major depressive disorder is a mental illness. It also may be called clinical depression or unipolar depression. Major depressive disorder usually causes feelings of sadness, hopelessness, or helplessness. Some people with this disorder do not feel particularly sad but lose interest in doing things they used to enjoy (anhedonia). Major depressive disorder also can cause physical symptoms. It can interfere with work, school, relationships, and other normal everyday activities. The disorder varies in severity but is longer lasting and more serious than the sadness we all feel from time to time in our lives. Major depressive disorder often is triggered by stressful life events or major life changes. Examples of these triggers include divorce, loss of your job or home, a move, and the death of a family member or close friend. Sometimes this disorder occurs for no obvious reason at all. People who have family members with major depressive disorder or bipolar disorder are at higher risk for developing this disorder, with or without life stressors. Major depressive disorder can occur at any age. It may occur just once in your life (single episode major depressive disorder). It may occur multiple times (recurrent major depressive disorder). SYMPTOMS People with major depressive disorder have either anhedonia or depressed mood on nearly a daily basis for at least 2 weeks or longer. Symptoms of depressed mood include:  Feelings of sadness (blue or down in the dumps) or emptiness.  Feelings of hopelessness or  helplessness.  Tearfulness or episodes of crying (may be observed by others).  Irritability (children and adolescents). In addition to depressed mood or anhedonia or both, people with this disorder have at least four of the following symptoms:  Difficulty sleeping or sleeping too much.   Significant change (increase or decrease) in appetite or weight.   Lack of energy or motivation.  Feelings of guilt and worthlessness.   Difficulty concentrating, remembering, or making decisions.  Unusually slow movement (psychomotor retardation) or restlessness (as observed by others).   Recurrent wishes for death, recurrent thoughts of self-harm (suicide), or a suicide attempt. People with major depressive disorder commonly have persistent negative thoughts about themselves, other people, and the world. People with severe major depressive disorder may experiencedistorted beliefs or perceptions about the world (psychotic delusions). They also may see or hear things that are not real (psychotic hallucinations). DIAGNOSIS Major depressive disorder is diagnosed through an assessment by your health care provider. Your health care provider will ask aboutaspects of your daily life, such as mood,sleep, and appetite, to see if you have the diagnostic symptoms of major depressive disorder. Your health care provider may ask about your medical history and use of alcohol or drugs, including prescription medicines. Your health care provider also may do a physical exam and blood work. This is because certain medical conditions and the use of certain substances can cause major depressive disorder-like symptoms (secondary depression). Your health care provider also may refer you to a mental health specialist for further evaluation and treatment. TREATMENT It is important to recognize the symptoms of major depressive disorder and seek treatment. The following treatments can be prescribed for this disorder:  Medicine. Antidepressant medicines usually are prescribed. Antidepressant medicines are thought to correct chemical imbalances in the brain that are commonly associated with major depressive disorder. Other types of medicine may be added if the symptoms do not respond to antidepressant medicines alone or if psychotic delusions or hallucinations occur.  Talk therapy. Talk therapy can be helpful in treating major depressive disorder by providing support, education, and guidance. Certain types of talk therapy also can help with negative thinking (cognitive behavioral therapy) and with relationship issues that trigger this disorder (interpersonal therapy). A mental health specialist can help determine which treatment is best for you. Most people with major depressive disorder do well with a combination of medicine and talk therapy. Treatments involving electrical stimulation of the brain can be used in situations with extremely severe symptoms or when medicine and talk therapy do not work over time. These treatments include electroconvulsive therapy, transcranial magnetic stimulation, and vagal nerve stimulation.   This information is not intended to replace advice given to you by your health care provider. Make sure you discuss any questions you have with your health care provider.   Document Released: 06/07/2012 Document Revised: 03/03/2014 Document Reviewed: 06/07/2012 Elsevier Interactive Patient Education [employer].

## 2016-05-28 ENCOUNTER — Ambulatory Visit: Payer: Self-pay | Admitting: Family

## 2016-06-02 ENCOUNTER — Ambulatory Visit (INDEPENDENT_AMBULATORY_CARE_PROVIDER_SITE_OTHER): Payer: 59 | Admitting: Family

## 2016-06-02 ENCOUNTER — Encounter: Payer: Self-pay | Admitting: Family

## 2016-06-02 DIAGNOSIS — F329 Major depressive disorder, single episode, unspecified: Secondary | ICD-10-CM

## 2016-06-02 DIAGNOSIS — D72829 Elevated white blood cell count, unspecified: Secondary | ICD-10-CM

## 2016-06-02 DIAGNOSIS — F32A Depression, unspecified: Secondary | ICD-10-CM

## 2016-06-02 DIAGNOSIS — F419 Anxiety disorder, unspecified: Principal | ICD-10-CM

## 2016-06-02 DIAGNOSIS — F418 Other specified anxiety disorders: Secondary | ICD-10-CM

## 2016-06-02 DIAGNOSIS — R739 Hyperglycemia, unspecified: Secondary | ICD-10-CM | POA: Diagnosis not present

## 2016-06-02 LAB — CBC WITH DIFFERENTIAL/PLATELET
BASOS ABS: 0.1 10*3/uL (ref 0.0–0.1)
Basophils Relative: 0.6 % (ref 0.0–3.0)
Eosinophils Absolute: 0.2 10*3/uL (ref 0.0–0.7)
Eosinophils Relative: 2.4 % (ref 0.0–5.0)
HEMATOCRIT: 39.1 % (ref 36.0–46.0)
HEMOGLOBIN: 13.1 g/dL (ref 12.0–15.0)
LYMPHS PCT: 22.9 % (ref 12.0–46.0)
Lymphs Abs: 2 10*3/uL (ref 0.7–4.0)
MCHC: 33.4 g/dL (ref 30.0–36.0)
MCV: 82.5 fl (ref 78.0–100.0)
Monocytes Absolute: 0.6 10*3/uL (ref 0.1–1.0)
Monocytes Relative: 6.6 % (ref 3.0–12.0)
Neutro Abs: 6 10*3/uL (ref 1.4–7.7)
Neutrophils Relative %: 67.5 % (ref 43.0–77.0)
Platelets: 359 10*3/uL (ref 150.0–400.0)
RBC: 4.74 Mil/uL (ref 3.87–5.11)
RDW: 15.2 % (ref 11.5–15.5)
WBC: 8.9 10*3/uL (ref 4.0–10.5)

## 2016-06-02 LAB — HEMOGLOBIN A1C: Hgb A1c MFr Bld: 5.4 % (ref 4.6–6.5)

## 2016-06-02 NOTE — Patient Instructions (Addendum)
Labs today  So glad you are doing so well

## 2016-06-02 NOTE — Assessment & Plan Note (Addendum)
Doing well on zoloft. Will continue. Discussed palpitations and offered cardiology consult; politely declines as they have been rare episodes since starting zoloft and a/w stress. She will let me know if recur.

## 2016-06-02 NOTE — Progress Notes (Signed)
Subjective:    Patient ID: Martha Miranda, female    DOB: July 29, 1980, 36 y.o.   MRN: [id number]  CC: Martha Miranda is a 36 y.o. female who presents today for follow up.   HPI: Started zoloft 8 weeks ago. Doing 'very well.'  Has seen eap 3 times since last visit and learning relaxation techniques  Does note more bruises since starting medication.   No CP, SOB. Has had two episodes where felt heart palpitations when stressed. No a/w with chest pain. Palpiations had been coming 1-2 per day prior to starting zoloft.     HISTORY:  Past Medical History:  Diagnosis Date  . Allergy   . Anemia   . Chicken pox   . CPD (cephalo-pelvic disproportion)   . Depression   . Dysmenorrhea   . Genital warts   . GERD (gastroesophageal reflux disease)   . Hydronephrosis   . Hypoglycemia   . Polycystic ovarian syndrome   . Urinary incontinence   . UTI (urinary tract infection)    Past Surgical History:  Procedure Laterality Date  . CESAREAN SECTION    . CHOLECYSTECTOMY  2011  . 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 LAPAROSCOPIC APPENDECTOMY N/A 12/24/2014   Procedure: APPENDECTOMY LAPAROSCOPIC;  Surgeon: Clayburn Pert, MD;  Location: [facility] ORS;  Service: General;  Laterality: N/A;  . LAPAROSCOPY  12/24/2014   Procedure: LAPAROSCOPY DIAGNOSTIC;  Surgeon: Clayburn Pert, MD;  Location: [facility] ORS;  Service: General;;  . partial oophorectomy     removed 1/2 of right ovary  . RIGHT OOPHORECTOMY  05/19/2013   & bitaleral tubal removal  . TONSILLECTOMY AND ADENOIDECTOMY  1988  . utetral stent placement w/ removal     Family History  Problem Relation Age of Onset  . Arthritis Mother   . Mental illness Mother   . Stroke Father   . High blood pressure Father   . Mental illness Father   . Diabetes Father   . Deep vein thrombosis Father   . Pulmonary embolism Father   . Heart attack Father   . Breast cancer Maternal Aunt   . Arthritis Maternal Grandmother   . High blood  pressure Maternal Grandmother   . Diabetes Maternal Grandmother   . Dementia Maternal Grandmother   . High blood pressure Maternal Grandfather   . Diabetes Maternal Grandfather   . Arthritis Paternal Grandmother   . High blood pressure Paternal Grandmother   . Kidney disease Paternal Grandmother   . Diabetes Paternal Grandmother   . Mental illness Paternal Grandfather   . Breast cancer Paternal Aunt     Allergies: Percocet [oxycodone-acetaminophen] and Chlorhexidine Current Outpatient Prescriptions on File Prior to Visit  Medication Sig Dispense Refill  . norgestimate-ethinyl estradiol (ORTHO-CYCLEN, 28,) 0.25-35 MG-MCG tablet Take 1 tablet by mouth daily. 1 Package 11  . pantoprazole (PROTONIX) 40 MG tablet Take 1 tablet (40 mg total) by mouth daily. 30 tablet 5  . sertraline (ZOLOFT) 50 MG tablet Take 1 tablet (50 mg total) by mouth at bedtime. 90 tablet 0   No current facility-administered medications on file prior to visit.     Social History  Substance Use Topics  . Smoking status: Never Smoker  . Smokeless tobacco: Never Used  . Alcohol use No     Comment: rare    Review of Systems  Constitutional: Negative for chills and fever.  Respiratory: Negative for cough.   Cardiovascular: Positive for palpitations. Negative for chest pain.  Gastrointestinal: Negative for nausea and vomiting.  Psychiatric/Behavioral: The patient is not nervous/anxious.       Objective:    BP 122/72   Pulse 91   Temp 98.4 F (36.9 C) (Oral)   Ht 5' (1.524 m)   Wt 224 lb 12.8 oz (102 kg)   SpO2 94%   BMI 43.90 kg/m  BP Readings from Last 3 Encounters:  06/02/16 122/72  04/15/16 100/72  01/04/16 104/70   Wt Readings from Last 3 Encounters:  06/02/16 224 lb 12.8 oz (102 kg)  04/15/16 227 lb 9.6 oz (103.2 kg)  01/04/16 225 lb (102.1 kg)    Physical Exam  Constitutional: She appears well-developed and well-nourished.  Eyes: Conjunctivae are normal.  Cardiovascular: Normal rate,  regular rhythm, normal heart sounds and normal pulses.   Pulmonary/Chest: Effort normal and breath sounds normal. She has no wheezes. She has no rhonchi. She has no rales.  Neurological: She is alert.  Skin: Skin is warm and dry.  Psychiatric: She has a normal mood and affect. Her speech is normal and behavior is normal. Thought content normal.  Vitals reviewed.      Assessment & Plan:   Problem List Items Addressed This Visit      Other   Anxiety and depression    Doing well on zoloft. Will continue. Discussed palpitations and offered cardiology consult; politely declines as they have been rare episodes since starting zoloft and a/w stress. She will let me know if recur.           I am having Martha Miranda maintain her norgestimate-ethinyl estradiol, pantoprazole, and sertraline.   No orders of the defined types were placed in this encounter.   Return precautions given.   Risks, benefits, and alternatives of the medications and treatment plan prescribed today were discussed, and patient expressed understanding.   Education regarding symptom management and diagnosis given to patient on AVS.  Continue to follow with Einar Pheasant, MD for routine health maintenance.   Martha Miranda and I agreed with plan.   Mable Paris, FNP

## 2016-06-02 NOTE — Progress Notes (Signed)
Pre visit review using our clinic review tool, if applicable. No additional management support is needed unless otherwise documented below in the visit note. 

## 2016-06-03 ENCOUNTER — Encounter: Payer: Self-pay | Admitting: Internal Medicine

## 2016-06-12 ENCOUNTER — Other Ambulatory Visit: Payer: Self-pay

## 2016-06-12 ENCOUNTER — Other Ambulatory Visit: Payer: Self-pay | Admitting: Obstetrics and Gynecology

## 2016-06-12 DIAGNOSIS — Z01419 Encounter for gynecological examination (general) (routine) without abnormal findings: Secondary | ICD-10-CM

## 2016-06-12 DIAGNOSIS — Z308 Encounter for other contraceptive management: Secondary | ICD-10-CM

## 2016-07-07 ENCOUNTER — Encounter: Payer: Self-pay | Admitting: Internal Medicine

## 2016-07-07 ENCOUNTER — Ambulatory Visit (INDEPENDENT_AMBULATORY_CARE_PROVIDER_SITE_OTHER): Payer: 59 | Admitting: Internal Medicine

## 2016-07-07 DIAGNOSIS — J019 Acute sinusitis, unspecified: Secondary | ICD-10-CM | POA: Diagnosis not present

## 2016-07-07 DIAGNOSIS — F329 Major depressive disorder, single episode, unspecified: Secondary | ICD-10-CM

## 2016-07-07 DIAGNOSIS — K2100 Gastro-esophageal reflux disease with esophagitis, without bleeding: Secondary | ICD-10-CM

## 2016-07-07 DIAGNOSIS — F32A Depression, unspecified: Secondary | ICD-10-CM

## 2016-07-07 DIAGNOSIS — F419 Anxiety disorder, unspecified: Secondary | ICD-10-CM

## 2016-07-07 MED ORDER — SERTRALINE HCL 50 MG PO TABS: 50.0000 mg | ORAL_TABLET | Freq: Every day | ORAL | 0 refills | Status: DC

## 2016-07-07 MED ORDER — AMOXICILLIN 875 MG PO TABS: 875.0000 mg | ORAL_TABLET | Freq: Two times a day (BID) | ORAL | 0 refills | Status: DC

## 2016-07-07 MED ORDER — PANTOPRAZOLE SODIUM 40 MG PO TBEC: 40.0000 mg | DELAYED_RELEASE_TABLET | Freq: Every day | ORAL | 5 refills | Status: DC

## 2016-07-07 NOTE — Patient Instructions (Signed)
Take a probiotic daily while you are on the antibiotics and for two weeks after completing the antibiotic.   

## 2016-07-07 NOTE — Progress Notes (Signed)
Pre-visit discussion using our clinic review tool. No additional management support is needed unless otherwise documented below in the visit note.  

## 2016-07-07 NOTE — Progress Notes (Signed)
Patient ID: Martha Miranda, female   DOB: 06/01/1980, 36 y.o.   MRN: [id number]   Subjective:    Patient ID: Martha Miranda, female    DOB: 1980/04/10, 37 y.o.   MRN: [id number]  HPI  Patient here for a scheduled follow up.  She reports that she is doing better.  Was experiencing increased anxiety.  See Joycelyn Schmid Arnett's note.  Started on zoloft.  Doing well on this medication.  No increased heart rate or palpitations.  No sob.  Eating and drinking well.  Does reports having increased sinus pressure.  Symptoms started 3 weeks ago and has now progressed.  Increased sinus pressure, nasal congestion and increased drainage.  Nasal congestion - productive of green and brown mucus.  No chest congestion.  Some cough related to drainage.  No vomiting.  Reports some breast fullness that cycles with her menstrual cycle.  Resolves once she starts her period.     Past Medical History:  Diagnosis Date  . Allergy   . Anemia   . Chicken pox   . CPD (cephalo-pelvic disproportion)   . Depression   . Dysmenorrhea   . Genital warts   . GERD (gastroesophageal reflux disease)   . Hydronephrosis   . Hypoglycemia   . Polycystic ovarian syndrome   . Urinary incontinence   . UTI (urinary tract infection)    Past Surgical History:  Procedure Laterality Date  . CESAREAN SECTION    . CHOLECYSTECTOMY  2011  . 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 LAPAROSCOPIC APPENDECTOMY N/A 12/24/2014   Procedure: APPENDECTOMY LAPAROSCOPIC;  Surgeon: Clayburn Pert, MD;  Location: [facility] ORS;  Service: General;  Laterality: N/A;  . LAPAROSCOPY  12/24/2014   Procedure: LAPAROSCOPY DIAGNOSTIC;  Surgeon: Clayburn Pert, MD;  Location: [facility] ORS;  Service: General;;  . partial oophorectomy     removed 1/2 of right ovary  . RIGHT OOPHORECTOMY  05/19/2013   & bitaleral tubal removal  . TONSILLECTOMY AND ADENOIDECTOMY  1988  . utetral stent placement w/ removal     Family History  Problem Relation Age of Onset    . Arthritis Mother   . Mental illness Mother   . Stroke Father   . High blood pressure Father   . Mental illness Father   . Diabetes Father   . Deep vein thrombosis Father   . Pulmonary embolism Father   . Heart attack Father   . Breast cancer Maternal Aunt   . Arthritis Maternal Grandmother   . High blood pressure Maternal Grandmother   . Diabetes Maternal Grandmother   . Dementia Maternal Grandmother   . High blood pressure Maternal Grandfather   . Diabetes Maternal Grandfather   . Arthritis Paternal Grandmother   . High blood pressure Paternal Grandmother   . Kidney disease Paternal Grandmother   . Diabetes Paternal Grandmother   . Mental illness Paternal Grandfather   . Breast cancer Paternal Aunt    Social History   Social History  . Marital status: Married    Spouse name: N/A  . Number of children: 1  . Years of education: N/A   Social History Main Topics  . Smoking status: Never Smoker  . Smokeless tobacco: Never Used  . Alcohol use No     Comment: rare  . Drug use: No  . Sexual activity: Yes    Birth control/ protection: Pill   Other Topics Concern  . 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 norgestimate-ethinyl estradiol (ORTHO-CYCLEN,SPRINTEC,PREVIFEM) 0.25-35 MG-MCG tablet TAKE 1 TABLET BY MOUTH DAILY.  . pantoprazole (PROTONIX) 40 MG tablet Take 1 tablet (40 mg total) by mouth daily.  . sertraline (ZOLOFT) 50 MG tablet Take 1 tablet (50 mg total) by mouth at bedtime.  . [DISCONTINUED] pantoprazole (PROTONIX) 40 MG tablet Take 1 tablet (40 mg total) by mouth daily.  . [DISCONTINUED] sertraline (ZOLOFT) 50 MG tablet Take 1 tablet (50 mg total) by mouth at bedtime.  Marland Kitchen amoxicillin (AMOXIL) 875 MG tablet Take 1 tablet (875 mg total) by mouth 2 (two) times daily.   No facility-administered encounter medications on file as  of 07/07/2016.     Review of Systems  Constitutional: Negative for appetite change and unexpected weight change.  HENT: Positive for congestion, postnasal drip and sinus pressure.   Eyes: Negative for pain and discharge.  Respiratory: Positive for cough. Negative for chest tightness and shortness of breath.   Cardiovascular: Negative for chest pain, palpitations and leg swelling.  Gastrointestinal: Negative for abdominal pain, diarrhea, nausea and vomiting.  Genitourinary: Negative for difficulty urinating and dysuria.  Musculoskeletal: Negative for back pain and joint swelling.  Skin: Negative for color change and rash.  Neurological: Negative for dizziness, light-headedness and headaches.  Psychiatric/Behavioral: Negative for agitation and dysphoric mood.       Objective:    Physical Exam  Constitutional: She appears well-developed and well-nourished. No distress.  HENT:  Mouth/Throat: Oropharynx is clear and moist.  Nares - slightly erythematous turbinates.  Minimal tenderness to palpation over the sinuses.    Neck: Neck supple. No thyromegaly present.  Cardiovascular: Normal rate and regular rhythm.   Pulmonary/Chest: Breath sounds normal. No respiratory distress. She has no wheezes.  Abdominal: Soft. Bowel sounds are normal. There is no tenderness.  Musculoskeletal: She exhibits no edema or tenderness.  Lymphadenopathy:    She has no cervical adenopathy.  Skin: No rash noted. No erythema.  Psychiatric: She has a normal mood and affect. Her behavior is normal.    BP 122/70 (BP Location: Left Arm, Patient Position: Sitting, Cuff Size: Normal)   Pulse 85   Temp 98.6 F (37 C) (Oral)   Resp 12   Ht 5' (1.524 m)   Wt 229 lb (103.9 kg)   SpO2 98%   BMI 44.72 kg/m  Wt Readings from Last 3 Encounters:  07/07/16 229 lb (103.9 kg)  06/02/16 224 lb 12.8 oz (102 kg)  04/15/16 227 lb 9.6 oz (103.2 kg)     Lab Results  Component Value Date   WBC 8.9 06/02/2016   HGB 13.1  06/02/2016   HCT 39.1 06/02/2016   PLT 359.0 06/02/2016   GLUCOSE 81 01/04/2016   CHOL 164 01/04/2016   TRIG 123.0 01/04/2016   HDL 51.90 01/04/2016   LDLCALC 87 01/04/2016   ALT 15 01/04/2016   AST 17 01/04/2016   NA 140 01/04/2016   K 4.0 01/04/2016   CL 102 01/04/2016   CREATININE 0.76 01/04/2016   BUN 12 01/04/2016   CO2 27 01/04/2016   TSH 1.44 01/04/2016   HGBA1C 5.4 06/02/2016       Assessment & Plan:   Problem List Items Addressed This Visit    Anxiety and depression    Doing well on zoloft.  Continue same medication.  Follow.        Relevant Medications   sertraline (ZOLOFT) 50 MG tablet   GERD (gastroesophageal reflux disease)    Controlled on protonix.  Follow.       Relevant Medications   pantoprazole (PROTONIX) 40 MG tablet   Sinusitis    With acute infection now.  Treat with amoxicillin.  Probiotic as directed.  nasacort nasal spray and mucinex/robitussin.  Follow.        Relevant Medications   amoxicillin (AMOXIL) 875 MG tablet       Einar Pheasant, MD

## 2016-07-14 ENCOUNTER — Encounter: Payer: Self-pay | Admitting: Internal Medicine

## 2016-07-14 NOTE — Assessment & Plan Note (Signed)
Doing well on zoloft.  Continue same medication.  Follow.

## 2016-07-14 NOTE — Assessment & Plan Note (Signed)
Controlled on protonix.  Follow.   

## 2016-07-14 NOTE — Assessment & Plan Note (Signed)
With acute infection now.  Treat with amoxicillin.  Probiotic as directed.  nasacort nasal spray and mucinex/robitussin.  Follow.

## 2016-07-25 ENCOUNTER — Other Ambulatory Visit: Payer: Self-pay

## 2016-07-25 DIAGNOSIS — J069 Acute upper respiratory infection, unspecified: Secondary | ICD-10-CM

## 2016-07-25 MED ORDER — AZITHROMYCIN 250 MG PO TABS: ORAL_TABLET | ORAL | 0 refills | Status: DC

## 2016-08-20 ENCOUNTER — Telehealth: Payer: Self-pay | Admitting: *Deleted

## 2016-08-20 NOTE — Telephone Encounter (Signed)
Patient states that left side of left breast is becoming sore all the time. She states it is tender to touch. Pain is 3/10 and feels like it is engorgement. Does not feel any bump or any thing abnormal. She does not have any changes in skin or d/c.

## 2016-08-20 NOTE — Telephone Encounter (Signed)
If persistent pain, I can see her tomorrow at 2:00 then can decide what is next best step.

## 2016-08-20 NOTE — Telephone Encounter (Signed)
Patient is currently having right breast pain. Back in April patient discussed the pain with Dr.Scott, the pain is now contenous. Patient requested a call to discuss, next steps for care. Pt contact [phone]

## 2016-08-20 NOTE — Telephone Encounter (Signed)
Called patient app made  

## 2016-08-21 ENCOUNTER — Ambulatory Visit (INDEPENDENT_AMBULATORY_CARE_PROVIDER_SITE_OTHER): Payer: 59 | Admitting: Internal Medicine

## 2016-08-21 ENCOUNTER — Encounter: Payer: Self-pay | Admitting: Internal Medicine

## 2016-08-21 VITALS — BP 126/86 | HR 97 | Temp 98.6°F | Resp 12 | Wt 229.0 lb

## 2016-08-21 DIAGNOSIS — E282 Polycystic ovarian syndrome: Secondary | ICD-10-CM | POA: Diagnosis not present

## 2016-08-21 DIAGNOSIS — N644 Mastodynia: Secondary | ICD-10-CM

## 2016-08-21 DIAGNOSIS — N926 Irregular menstruation, unspecified: Secondary | ICD-10-CM | POA: Diagnosis not present

## 2016-08-21 LAB — HCG, QUANTITATIVE, PREGNANCY: Quantitative HCG: 0.35 m[IU]/mL

## 2016-08-21 NOTE — Progress Notes (Signed)
Pre-visit discussion using our clinic review tool. No additional management support is needed unless otherwise documented below in the visit note.  

## 2016-08-21 NOTE — Progress Notes (Signed)
Patient ID: Martha Miranda, female   DOB: 04/16/1980, 36 y.o.   MRN: [id number]   Subjective:    Patient ID: Martha Miranda, female    DOB: [birth year], 36 y.o.   MRN: [id number]  HPI  Patient here as a work in with concerns regarding breast tenderness.   She had some breast tenderness previously that resolved with her period.  See last note for details.  Reports that for the last two weeks, she has had increased pain in her left breast - 2:00 -3:00 region.  No nipple discharge.  That area feels engorged.  More full.  No redness.  No increased warmth.  She has recently noticed a change in her periods.  Last two have been a little differed as far as flow and more dark blood at the end.  Eating and drinking fine.  No nausea or vomiting.  Bowels moving.    Past Medical History:  Diagnosis Date  . Allergy   . Anemia   . Chicken pox   . CPD (cephalo-pelvic disproportion)   . Depression   . Dysmenorrhea   . Genital warts   . GERD (gastroesophageal reflux disease)   . Hydronephrosis   . Hypoglycemia   . Polycystic ovarian syndrome   . Urinary incontinence   . UTI (urinary tract infection)    Past Surgical History:  Procedure Laterality Date  . CESAREAN SECTION    . CHOLECYSTECTOMY  2011  . 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 LAPAROSCOPIC APPENDECTOMY N/A 12/24/2014   Procedure: APPENDECTOMY LAPAROSCOPIC;  Surgeon: Clayburn Pert, MD;  Location: [facility];  Service: General;  Laterality: N/A;  . LAPAROSCOPY  12/24/2014   Procedure: LAPAROSCOPY DIAGNOSTIC;  Surgeon: Clayburn Pert, MD;  Location: [facility];  Service: General;;  . partial oophorectomy     removed 1/2 of right ovary  . RIGHT OOPHORECTOMY  05/19/2013   & bitaleral tubal removal  . TONSILLECTOMY AND ADENOIDECTOMY  1988  . utetral stent placement w/ removal     Family History  Problem Relation Age of Onset  . Arthritis Mother   . Mental illness Mother   . Stroke Father   . High blood pressure Father   .  Mental illness Father   . Diabetes Father   . Deep vein thrombosis Father   . Pulmonary embolism Father   . Heart attack Father   . Breast cancer Maternal Aunt   . Arthritis Maternal Grandmother   . High blood pressure Maternal Grandmother   . Diabetes Maternal Grandmother   . Dementia Maternal Grandmother   . High blood pressure Maternal Grandfather   . Diabetes Maternal Grandfather   . Arthritis Paternal Grandmother   . High blood pressure Paternal Grandmother   . Kidney disease Paternal Grandmother   . Diabetes Paternal Grandmother   . Mental illness Paternal Grandfather   . Breast cancer Paternal Aunt    Social History   Social History  . Marital status: Married    Spouse name: N/A  . Number of children: 1  . Years of education: N/A   Social History Main Topics  . Smoking status: Never Smoker  . Smokeless tobacco: Never Used  . Alcohol use No     Comment: rare  . Drug use: No  . Sexual activity: Yes    Birth control/ protection: Pill   Other Topics Concern  . 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 norgestimate-ethinyl estradiol (ORTHO-CYCLEN,SPRINTEC,PREVIFEM) 0.25-35 MG-MCG tablet TAKE 1 TABLET BY MOUTH DAILY.  . pantoprazole (PROTONIX) 40 MG tablet Take 1 tablet (40 mg total) by mouth daily.  . sertraline (ZOLOFT) 50 MG tablet Take 1 tablet (50 mg total) by mouth at bedtime.  . [DISCONTINUED] amoxicillin (AMOXIL) 875 MG tablet Take 1 tablet (875 mg total) by mouth 2 (two) times daily.  . [DISCONTINUED] azithromycin (ZITHROMAX) 250 MG tablet Take 500 MG on day one, 250 MG once daily on day 2,3,4 and 5   No facility-administered encounter medications on file as of 08/21/2016.     Review of Systems  Constitutional: Negative for appetite change and fever.  Respiratory: Negative for cough, chest tightness and shortness of  breath.   Cardiovascular: Negative for chest pain.  Gastrointestinal: Negative for abdominal pain, diarrhea, nausea and vomiting.  Musculoskeletal: Negative for back pain and joint swelling.  Skin: Negative for color change and rash.  Psychiatric/Behavioral: Negative for agitation and dysphoric mood.       Objective:    Physical Exam  Constitutional: She appears well-developed and well-nourished. No distress.  Neck: Neck supple.  Cardiovascular: Normal rate and regular rhythm.   Pulmonary/Chest: Breath sounds normal. No respiratory distress. She has no wheezes.  Breasts:  Right breast -  No palpable nodules or axillary adenopathy.  No nipple retraction or nipple discharge present.  Left breast:  No nipple discharge or nipple retraction present.  Increased pain in the 2:00-3:00 region left breast.  Some palpable fullness in this area.  No increased redness or warmth.  No axillary adenopathy.    Abdominal: Soft. Bowel sounds are normal. There is no tenderness.  Lymphadenopathy:    She has no cervical adenopathy.  Skin: No rash noted. No erythema.  Psychiatric: She has a normal mood and affect. Her behavior is normal.    BP 126/86 (BP Location: Left Arm, Patient Position: Sitting, Cuff Size: Normal)   Pulse 97   Temp 98.6 F (37 C) (Oral)   Resp 12   Wt 229 lb (103.9 kg)   LMP 08/06/2016   SpO2 97%   BMI 44.72 kg/m  Wt Readings from Last 3 Encounters:  08/21/16 229 lb (103.9 kg)  07/07/16 229 lb (103.9 kg)  06/02/16 224 lb 12.8 oz (102 kg)     Lab Results  Component Value Date   WBC 8.9 06/02/2016   HGB 13.1 06/02/2016   HCT 39.1 06/02/2016   PLT 359.0 06/02/2016   GLUCOSE 81 01/04/2016   CHOL 164 01/04/2016   TRIG 123.0 01/04/2016   HDL 51.90 01/04/2016   LDLCALC 87 01/04/2016   ALT 15 01/04/2016   AST 17 01/04/2016   NA 140 01/04/2016   K 4.0 01/04/2016   CL 102 01/04/2016   CREATININE 0.76 01/04/2016   BUN 12 01/04/2016   CO2 27 01/04/2016   TSH 1.44  01/04/2016   HGBA1C 5.4 06/02/2016       Assessment & Plan:   Problem List Items Addressed This Visit    Breast tenderness in female    Increased pain in left breast as outlined.  Given persistent pain and fullness, will obtain left breast ultrasound to better evaluate.  Pt in agreement.  Will check pregnancy test.        PCOS (polycystic ovarian syndrome)    She was questioning  if some her period changes are related to her PCOS.  Follow menstrual cycle.         Other Visit Diagnoses    Menstrual changes    -  Primary   Relevant Orders   B-HCG Quant (Completed)   Breast pain       Relevant Orders   US BREAST LTD UNI LEFT INC AXILLA   MM DIAG BREAST TOMO BILATERAL       Einar Pheasant, MD

## 2016-08-23 DIAGNOSIS — N644 Mastodynia: Secondary | ICD-10-CM | POA: Insufficient documentation

## 2016-08-23 NOTE — Assessment & Plan Note (Addendum)
Increased pain in left breast as outlined.  Given persistent pain and fullness, will obtain left breast ultrasound to better evaluate.  Pt in agreement.  Will check pregnancy test.

## 2016-08-23 NOTE — Assessment & Plan Note (Signed)
She was questioning if some her period changes are related to her PCOS.  Follow menstrual cycle.

## 2016-08-25 ENCOUNTER — Ambulatory Visit
Admission: RE | Admit: 2016-08-25 | Discharge: 2016-08-25 | Disposition: A | Discharge status: Home / Self Care | Payer: 59 | Source: Ambulatory Visit | Attending: Internal Medicine | Admitting: Internal Medicine

## 2016-08-25 DIAGNOSIS — N644 Mastodynia: Secondary | ICD-10-CM | POA: Insufficient documentation

## 2016-08-25 DIAGNOSIS — R928 Other abnormal and inconclusive findings on diagnostic imaging of breast: Secondary | ICD-10-CM | POA: Diagnosis not present

## 2016-08-25 DIAGNOSIS — N6489 Other specified disorders of breast: Secondary | ICD-10-CM | POA: Diagnosis not present

## 2016-08-27 ENCOUNTER — Other Ambulatory Visit: Payer: Self-pay | Admitting: Internal Medicine

## 2016-08-27 DIAGNOSIS — N644 Mastodynia: Secondary | ICD-10-CM

## 2016-08-27 NOTE — Progress Notes (Signed)
Order placed for referral to surgery.  

## 2016-08-29 ENCOUNTER — Other Ambulatory Visit: Payer: Self-pay

## 2016-09-01 ENCOUNTER — Other Ambulatory Visit: Payer: Self-pay

## 2016-10-02 ENCOUNTER — Other Ambulatory Visit: Payer: Self-pay | Admitting: Internal Medicine

## 2016-10-02 DIAGNOSIS — F32A Depression, unspecified: Secondary | ICD-10-CM

## 2016-10-02 DIAGNOSIS — F329 Major depressive disorder, single episode, unspecified: Secondary | ICD-10-CM

## 2016-10-02 DIAGNOSIS — F419 Anxiety disorder, unspecified: Principal | ICD-10-CM

## 2016-10-15 ENCOUNTER — Ambulatory Visit (INDEPENDENT_AMBULATORY_CARE_PROVIDER_SITE_OTHER): Payer: 59 | Admitting: Internal Medicine

## 2016-10-15 ENCOUNTER — Encounter: Payer: Self-pay | Admitting: Internal Medicine

## 2016-10-15 DIAGNOSIS — D369 Benign neoplasm, unspecified site: Secondary | ICD-10-CM

## 2016-10-15 DIAGNOSIS — E282 Polycystic ovarian syndrome: Secondary | ICD-10-CM | POA: Diagnosis not present

## 2016-10-15 DIAGNOSIS — F419 Anxiety disorder, unspecified: Secondary | ICD-10-CM

## 2016-10-15 DIAGNOSIS — N644 Mastodynia: Secondary | ICD-10-CM | POA: Diagnosis not present

## 2016-10-15 DIAGNOSIS — K2100 Gastro-esophageal reflux disease with esophagitis, without bleeding: Secondary | ICD-10-CM

## 2016-10-15 DIAGNOSIS — F32A Depression, unspecified: Secondary | ICD-10-CM

## 2016-10-15 DIAGNOSIS — F329 Major depressive disorder, single episode, unspecified: Secondary | ICD-10-CM

## 2016-10-15 NOTE — Progress Notes (Signed)
Pre-visit discussion using our clinic review tool. No additional management support is needed unless otherwise documented below in the visit note.  

## 2016-10-15 NOTE — Progress Notes (Signed)
Patient ID: Martha Miranda, female   DOB: 1980/03/29, 36 y.o.   MRN: [id number]   Subjective:    Patient ID: Martha Miranda, female    DOB: 25-Jul-1980, 36 y.o.   MRN: [id number]  HPI  Patient here for a scheduled follow up.  She reports she is doing relatively well.  Stress is better.  She was having left breast pain.  See previous note.  Had mammogram and ultrasound - ok.  Works with [employer].  Had one of them look over her mammogram.  Discussed following.  She reports that since starting her period, no pain.  No fullness.  Wants to monitor.  No problems currently.  She is having regular periods.  No spotting in between periods.  Has a history of dermoid cyst.  Describes having persistent lower quadrant discomfort.  States this is something she has noticed for a long while.  Has seen gyn previously.  Has had surgery previously for ovarian cyst.  Discussed f/u with gyn.  No urine change.  No vaginal discharge.     Past Medical History:  Diagnosis Date  . Allergy   . Anemia   . Chicken pox   . CPD (cephalo-pelvic disproportion)   . Depression   . Dysmenorrhea   . Genital warts   . GERD (gastroesophageal reflux disease)   . Hydronephrosis   . Hypoglycemia   . Polycystic ovarian syndrome   . Urinary incontinence   . UTI (urinary tract infection)    Past Surgical History:  Procedure Laterality Date  . CESAREAN SECTION    . CHOLECYSTECTOMY  2011  . 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 LAPAROSCOPIC APPENDECTOMY N/A 12/24/2014   Procedure: APPENDECTOMY LAPAROSCOPIC;  Surgeon: Clayburn Pert, MD;  Location: [facility];  Service: General;  Laterality: N/A;  . LAPAROSCOPY  12/24/2014   Procedure: LAPAROSCOPY DIAGNOSTIC;  Surgeon: Clayburn Pert, MD;  Location: [facility];  Service: General;;  . partial oophorectomy     removed 1/2 of right ovary  . RIGHT OOPHORECTOMY  05/19/2013   & bitaleral tubal removal  . TONSILLECTOMY AND ADENOIDECTOMY  1988  . utetral stent  placement w/ removal     Family History  Problem Relation Age of Onset  . Arthritis Mother   . Mental illness Mother   . Stroke Father   . High blood pressure Father   . Mental illness Father   . Diabetes Father   . Deep vein thrombosis Father   . Pulmonary embolism Father   . Heart attack Father   . Breast cancer Maternal Aunt 82  . Arthritis Maternal Grandmother   . High blood pressure Maternal Grandmother   . Diabetes Maternal Grandmother   . Dementia Maternal Grandmother   . High blood pressure Maternal Grandfather   . Diabetes Maternal Grandfather   . Arthritis Paternal Grandmother   . High blood pressure Paternal Grandmother   . Kidney disease Paternal Grandmother   . Diabetes Paternal Grandmother   . Mental illness Paternal Grandfather   . Breast cancer Paternal Aunt 70   Social History   Social History  . Marital status: Married    Spouse name: N/A  . Number of children: 1  . Years of education: N/A   Social History Main Topics  . Smoking status: Never Smoker  . Smokeless tobacco: Never Used  . Alcohol use No     Comment: rare  . Drug use: No  . Sexual activity: Yes  Birth control/ protection: Pill   Other Topics Concern  . 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 norgestimate-ethinyl estradiol (ORTHO-CYCLEN,SPRINTEC,PREVIFEM) 0.25-35 MG-MCG tablet TAKE 1 TABLET BY MOUTH DAILY.  . pantoprazole (PROTONIX) 40 MG tablet Take 1 tablet (40 mg total) by mouth daily.  . sertraline (ZOLOFT) 50 MG tablet TAKE 1 TABLET BY MOUTH AT BEDTIME   No facility-administered encounter medications on file as of 10/15/2016.     Review of Systems  Constitutional: Negative for appetite change and unexpected weight change.  HENT: Negative for congestion and sinus pressure.   Respiratory: Negative for cough, chest tightness and shortness of  breath.   Cardiovascular: Negative for chest pain, palpitations and leg swelling.  Gastrointestinal: Negative for diarrhea, nausea and vomiting.       Some abdominal discomfort as outlined.    Genitourinary: Negative for difficulty urinating and dysuria.  Musculoskeletal: Negative for back pain and joint swelling.  Skin: Negative for color change and rash.  Neurological: Negative for dizziness, light-headedness and headaches.  Psychiatric/Behavioral: Negative for agitation and dysphoric mood.       Objective:    Physical Exam  Constitutional: She appears well-developed and well-nourished. No distress.  HENT:  Nose: Nose normal.  Mouth/Throat: Oropharynx is clear and moist.  Neck: Neck supple. No thyromegaly present.  Cardiovascular: Normal rate and regular rhythm.   Pulmonary/Chest: Breath sounds normal. No respiratory distress. She has no wheezes.  Abdominal: Soft. Bowel sounds are normal. There is no tenderness.  Musculoskeletal: She exhibits no edema or tenderness.  Lymphadenopathy:    She has no cervical adenopathy.  Skin: No rash noted. No erythema.  Psychiatric: She has a normal mood and affect. Her behavior is normal.    BP 124/84 (BP Location: Left Arm, Patient Position: Sitting, Cuff Size: Normal)   Pulse 87   Temp 98.6 F (37 C) (Oral)   Resp 12   Ht 5' (1.524 m)   Wt 232 lb 12.8 oz (105.6 kg)   LMP 10/01/2016   SpO2 97%   BMI 45.47 kg/m  Wt Readings from Last 3 Encounters:  10/15/16 232 lb 12.8 oz (105.6 kg)  08/21/16 229 lb (103.9 kg)  07/07/16 229 lb (103.9 kg)     Lab Results  Component Value Date   WBC 8.9 06/02/2016   HGB 13.1 06/02/2016   HCT 39.1 06/02/2016   PLT 359.0 06/02/2016   GLUCOSE 81 01/04/2016   CHOL 164 01/04/2016   TRIG 123.0 01/04/2016   HDL 51.90 01/04/2016   LDLCALC 87 01/04/2016   ALT 15 01/04/2016   AST 17 01/04/2016   NA 140 01/04/2016   K 4.0 01/04/2016   CL 102 01/04/2016   CREATININE 0.76 01/04/2016   BUN 12  01/04/2016   CO2 27 01/04/2016   TSH 1.44 01/04/2016   HGBA1C 5.4 06/02/2016    US Breast Ltd Uni Left Inc Axilla  Result Date: 08/25/2016 CLINICAL DATA:  36 year old female with left lateral breast pain since mid June 2018. EXAM: 2D DIGITAL DIAGNOSTIC BILATERAL MAMMOGRAM WITH CAD AND ADJUNCT TOMO ULTRASOUND LEFT BREAST COMPARISON:  Previous exam(s). ACR Breast Density Category a: The breast tissue is almost entirely fatty. FINDINGS: No suspicious mass, calcifications, or other abnormality is identified within either breast. Mammographic images were processed with CAD. On physical  exam, no discrete mass is felt in the patient's area of concern within the lateral left breast. The patient demonstrates significant tenderness on exam from 2-4 o'clock. Targeted ultrasound of the lateral left breast was performed from 2-4 o'clock demonstrating no suspicious cystic or solid sonographic finding in the area of concern. IMPRESSION: No mammographic or sonographic evidence of malignancy. RECOMMENDATION: 1. Clinical management of the patient's left breast pain is recommended. The patient was asked to follow-up with her referring clinician. 2.  Screening mammogram in one year.(Code:SM-B-01Y) I have discussed the findings and recommendations with the patient. Results were also provided in writing at the conclusion of the visit. If applicable, a reminder letter will be sent to the patient regarding the next appointment. BI-RADS CATEGORY  1: Negative. Electronically Signed   By: Pamelia Hoit M.D.   On: 08/25/2016 17:06   Mm Diag Breast Tomo Bilateral  Result Date: 08/25/2016 CLINICAL DATA:  36 year old female with left lateral breast pain since mid June 2018. EXAM: 2D DIGITAL DIAGNOSTIC BILATERAL MAMMOGRAM WITH CAD AND ADJUNCT TOMO ULTRASOUND LEFT BREAST COMPARISON:  Previous exam(s). ACR Breast Density Category a: The breast tissue is almost entirely fatty. FINDINGS: No suspicious mass, calcifications, or other abnormality  is identified within either breast. Mammographic images were processed with CAD. On physical exam, no discrete mass is felt in the patient's area of concern within the lateral left breast. The patient demonstrates significant tenderness on exam from 2-4 o'clock. Targeted ultrasound of the lateral left breast was performed from 2-4 o'clock demonstrating no suspicious cystic or solid sonographic finding in the area of concern. IMPRESSION: No mammographic or sonographic evidence of malignancy. RECOMMENDATION: 1. Clinical management of the patient's left breast pain is recommended. The patient was asked to follow-up with her referring clinician. 2.  Screening mammogram in one year.(Code:SM-B-01Y) I have discussed the findings and recommendations with the patient. Results were also provided in writing at the conclusion of the visit. If applicable, a reminder letter will be sent to the patient regarding the next appointment. BI-RADS CATEGORY  1: Negative. Electronically Signed   By: Pamelia Hoit M.D.   On: 08/25/2016 17:06       Assessment & Plan:   Problem List Items Addressed This Visit    Anxiety and depression    Doing well on zoloft.        Breast tenderness in female    Pain better now as outlined.  No fullness.  Appears to change with her menstrual cycle.  Mammogram and ultrasound as outlined.  Desires no further evaluation or w/up.        Dermoid tumor    S/p right oophorectomy.  H/o left dermoid cyst.  With some persistent discomfort, no significant change.  Recent hot flashes.  She reports period change, but appears to be regular.  No spotting.  Sees gyn.  Recommended f/u.  She agrees.  States due for f/u pap.  She will call and make her appt.  Let me know if problems.        GERD (gastroesophageal reflux disease)    Controlled on protonix.        PCOS (polycystic ovarian syndrome)    If followed by gyn.  On ocp's.            Einar Pheasant, MD

## 2016-10-16 ENCOUNTER — Encounter: Payer: Self-pay | Admitting: Internal Medicine

## 2016-10-16 NOTE — Assessment & Plan Note (Signed)
If followed by gyn.  On ocp's.

## 2016-10-16 NOTE — Assessment & Plan Note (Signed)
Controlled on protonix.   

## 2016-10-16 NOTE — Assessment & Plan Note (Addendum)
S/p right oophorectomy.  H/o left dermoid cyst.  With some persistent discomfort, no significant change.  Recent hot flashes.  She reports period change, but appears to be regular.  No spotting.  Sees gyn.  Recommended f/u.  She agrees.  States due for f/u pap.  She will call and make her appt.  Let me know if problems.

## 2016-10-16 NOTE — Assessment & Plan Note (Signed)
Pain better now as outlined.  No fullness.  Appears to change with her menstrual cycle.  Mammogram and ultrasound as outlined.  Desires no further evaluation or w/up.

## 2016-10-16 NOTE — Assessment & Plan Note (Signed)
Doing well on zoloft

## 2016-11-17 ENCOUNTER — Other Ambulatory Visit: Payer: Self-pay | Admitting: Obstetrics and Gynecology

## 2016-11-17 DIAGNOSIS — D279 Benign neoplasm of unspecified ovary: Secondary | ICD-10-CM

## 2016-11-19 ENCOUNTER — Encounter: Payer: Self-pay | Admitting: Obstetrics and Gynecology

## 2016-11-19 ENCOUNTER — Ambulatory Visit (INDEPENDENT_AMBULATORY_CARE_PROVIDER_SITE_OTHER): Payer: 59

## 2016-11-19 ENCOUNTER — Ambulatory Visit (INDEPENDENT_AMBULATORY_CARE_PROVIDER_SITE_OTHER): Payer: 59 | Admitting: Obstetrics and Gynecology

## 2016-11-19 VITALS — BP 120/74 | HR 84 | Ht 60.0 in | Wt 243.7 lb

## 2016-11-19 DIAGNOSIS — E349 Endocrine disorder, unspecified: Secondary | ICD-10-CM

## 2016-11-19 DIAGNOSIS — D369 Benign neoplasm, unspecified site: Secondary | ICD-10-CM

## 2016-11-19 DIAGNOSIS — D279 Benign neoplasm of unspecified ovary: Secondary | ICD-10-CM | POA: Diagnosis not present

## 2016-11-19 DIAGNOSIS — E282 Polycystic ovarian syndrome: Secondary | ICD-10-CM | POA: Diagnosis not present

## 2016-11-19 MED ORDER — DROSPIRENONE-ETHINYL ESTRADIOL 3-0.03 MG PO TABS: 1.0000 | ORAL_TABLET | Freq: Every day | ORAL | 11 refills | Status: DC

## 2016-11-19 NOTE — Progress Notes (Signed)
GYNECOLOGY PROGRESS NOTE  Subjective:    Patient ID: Martha Miranda, female    DOB: 1980/07/26, 36 y.o.   MRN: [id number]  HPI  Patient is a 36 y.o. G63P1102 female who presents for complaints of multiple symptoms, including increased irritability, LLQ fullness (constant), hot flushes, hair loss (on head), worsening acne and facial hair growth. Symptoms have been going on for months.  Went to PCP for breast pain/fullness x 1 month. Had normal mammogram and ultrasound. Ultrasound noted that she had a moderate sized possible dermoid cyst.  Desires to discuss options for management.  The following portions of the patient's history were reviewed and updated as appropriate: allergies, current medications, past family history, past medical history, past social history, past surgical history and problem list.  Review of Systems Pertinent items noted in HPI and remainder of comprehensive ROS otherwise negative.   Objective:   Blood pressure 120/74, pulse 84, height 5' (1.524 m), weight 243 lb 11.2 oz (110.5 kg), last menstrual period 10/29/2016. General appearance: alert and no distress Abdomen: soft, non-tender; bowel sounds normal; no masses,  no organomegaly Pelvic: external genitalia normal, rectovaginal septum normal.  Vagina without discharge.  Cervix normal appearing, no lesions and no motion tenderness.  Uterus mobile, nontender, normal shape and size.  Adnexae non-palpable, nontender bilaterally.  Extremities: extremities normal, atraumatic, no cyanosis or edema Neurologic: Grossly normal   Imaging: ULTRASOUND REPORT  Location: [facility] Date of Service: 11/19/16   Indications: F/U Left ovarian cyst Findings:  The uterus appears WNL and measures 7.6 x 3.0 x 3.4 cm.  A prior C-Section scar is noted in the anterior LUS. Echo texture is homogenous without evidence of focal masses.  The Endometrium appears WNL and measures 1.7 mm.  Right Ovary is surgicallly  absent. Right adnexa appears WNL. Left Ovary is enlarged and measures 6.2 x 3.4 x 6.4 cm. There is a 4.9 x 3.5 x 4.3 cm complex cystic mass with bright echogenic echoes seen which is most likely compatible with a dermoid cyst. Survey of the left adnexa demonstrates no obvious adnexal masses. There is no free fluid in the cul de sac.  Impression: 1. Uterus appears WNL. 2. Probable left dermoid cyst.  Recommendations: 1.Clinical correlation with the patient's History and Physical Exam.  Assessment:   Hormonal imbalance PCOS Dermoid cyst  Plan:   - Patient with multiple symptoms that may be related to hormonal disturbance. Is currently on Sprintec, has been on this for several years. Also has a h/o PCOS. Offered option of changing to different OCP, one that may better help manage some of her symptoms. Will switch to [place].   - Dermoid cyst, currently ~ 5 cm.  Relatively asymptomatic except for occasional twinges of discomfort.  Discussed that as patient only has 1 ovary and portion is taken up by the cyst, this could also be causing some hormonal fluctuations.  Patient currently wants to avoid surgery, as she is concerned regarding losing her remaining ovary (notes that this is what happened with her previous ovary, she had a cyst and was supposed to have a cystectomy but the cyst was too large and could not be distinguished from the ovarian tissue so she ended up with an oophorectomy). Advised that as long as she remained relatively asymptomatic, we could continue to watch the cyst.  If she begins to develop pain, patient will consider cystectomy at that time. Advised that if the cyst continues to grow to over 7-8 cm, we  then become concerned for torsion symptoms. Patient notes understanding.    A total of 15 minutes were spent face-to-face with the patient during this encounter and over half of that time dealt with counseling and coordination of care.   Rubie Maid, MD [facility]

## 2016-11-20 MED ORDER — NORETHIN ACE-ETH ESTRAD-FE 1.5-30 MG-MCG PO TABS: 1.0000 | ORAL_TABLET | Freq: Every day | ORAL | 3 refills | Status: DC

## 2016-12-31 ENCOUNTER — Other Ambulatory Visit: Payer: Self-pay | Admitting: Internal Medicine

## 2017-01-26 ENCOUNTER — Encounter: Payer: Self-pay | Admitting: Obstetrics and Gynecology

## 2017-01-27 ENCOUNTER — Other Ambulatory Visit: Payer: Self-pay | Admitting: Internal Medicine

## 2017-01-27 DIAGNOSIS — F419 Anxiety disorder, unspecified: Principal | ICD-10-CM

## 2017-01-27 DIAGNOSIS — F329 Major depressive disorder, single episode, unspecified: Secondary | ICD-10-CM

## 2017-02-23 ENCOUNTER — Ambulatory Visit: Payer: Self-pay | Admitting: Internal Medicine

## 2017-02-24 IMAGING — CT CT ABD-PELV W/ CM
1 of 2 series · 15 of 32 positions shown, 19 images · IV contrast (omnipaque)
Comparison: CT 12/10/2014.  Pelvic ultrasound 05/03/2013

CLINICAL DATA: Right lower quadrant abdominal pain and tenderness
with rebound. Midline pain radiating to the right. Nausea and
vomiting. Symptoms for 4-5 days.

EXAM:
CT ABDOMEN AND PELVIS WITH CONTRAST
TECHNIQUE: Multidetector CT imaging of the abdomen and pelvis was performed
using the standard protocol following bolus administration of
intravenous contrast.
CONTRAST:  100mL OMNIPAQUE IOHEXOL 300 MG/ML  SOLN

[Series 2: routine abd pel with · axial · 0.92mm/px · z∈[-236,+194]mm · 15 of 94 slices shown, 19 images]
[im 4/94  soft-tissue]
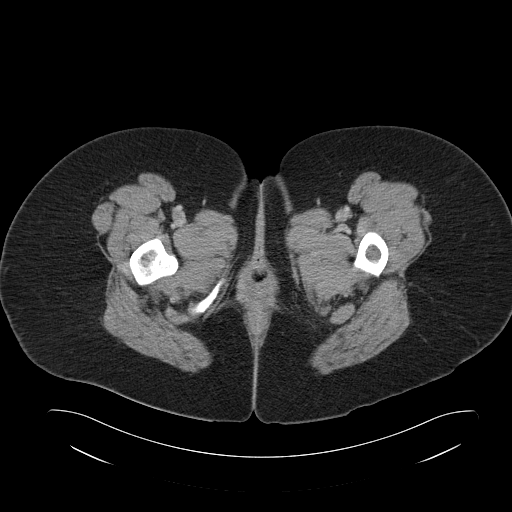
[im 4/94  bone]
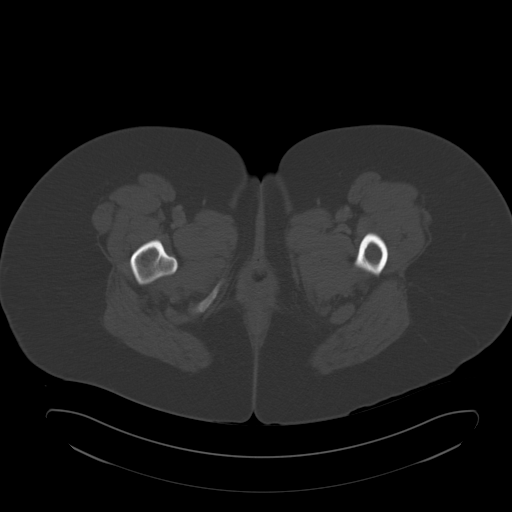
[im 12/94  soft-tissue]
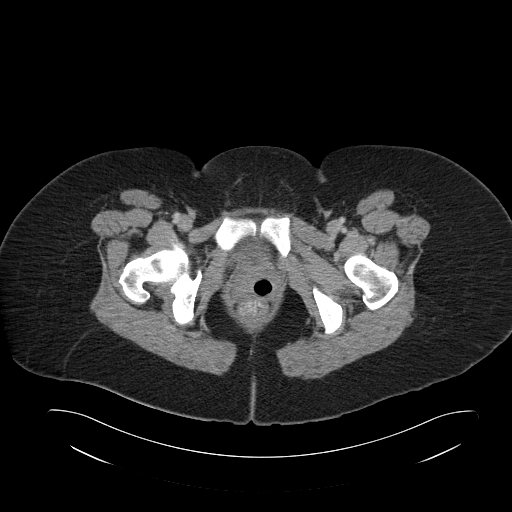
[im 20/94  soft-tissue]
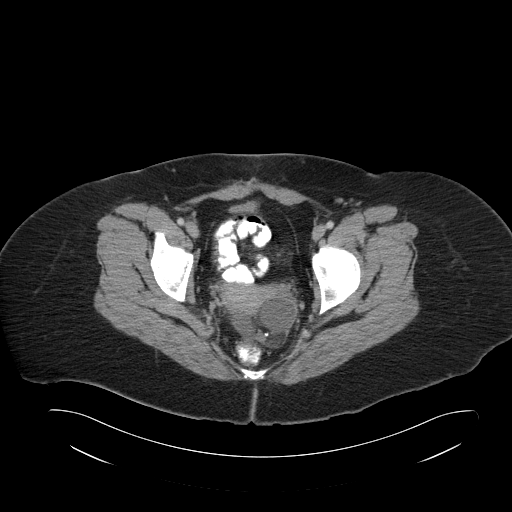
[im 28/94  soft-tissue]
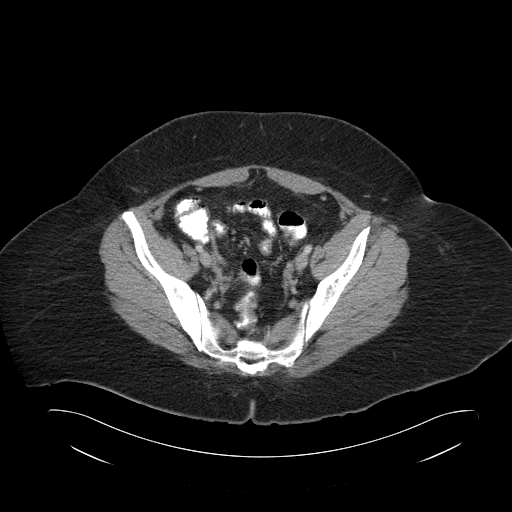
[im 32/94  soft-tissue]
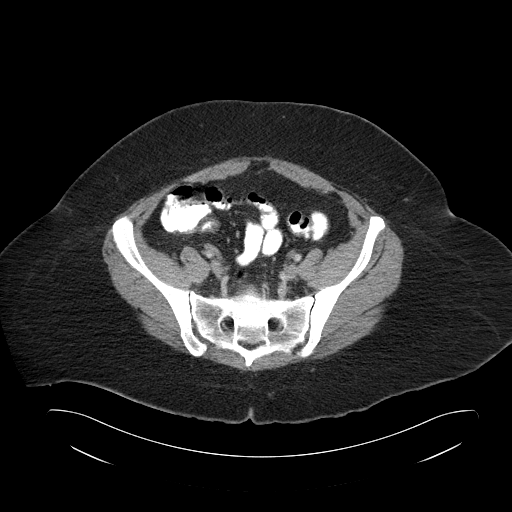
[im 39/94  soft-tissue]
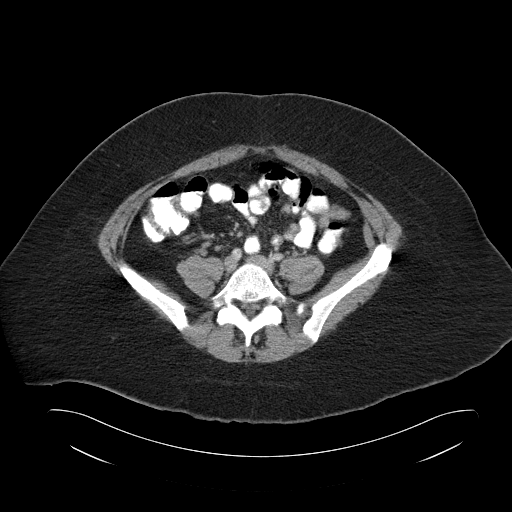
[im 47/94  soft-tissue]
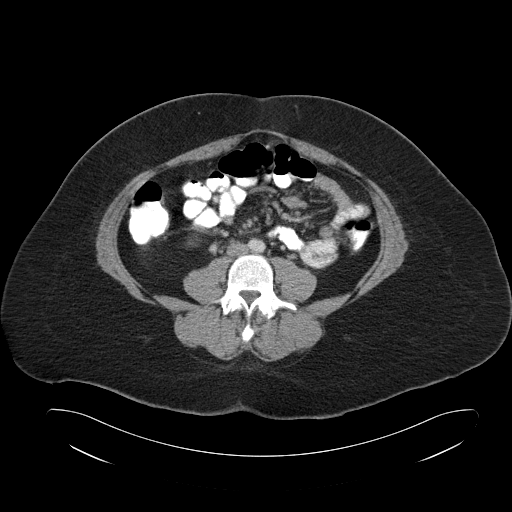
[im 55/94  soft-tissue]
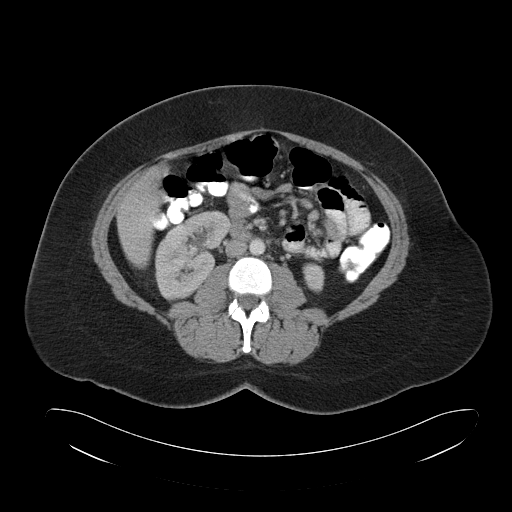
[im 63/94  soft-tissue]
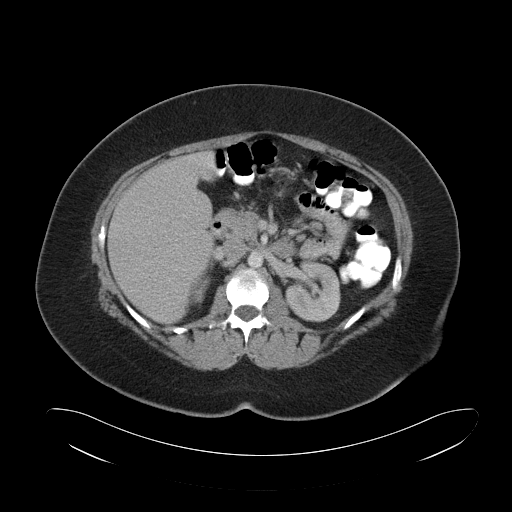
[im 63/94  bone]
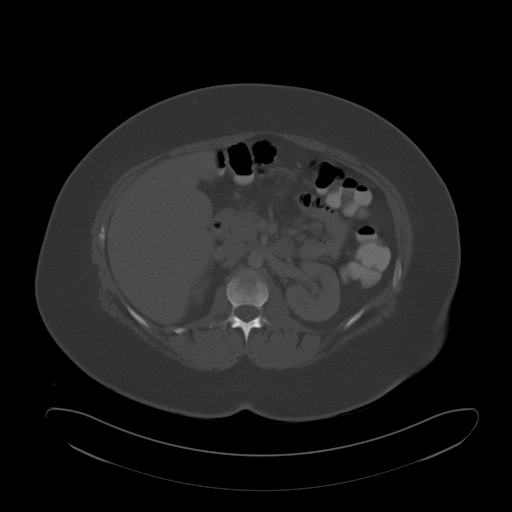
[im 66/94  soft-tissue]
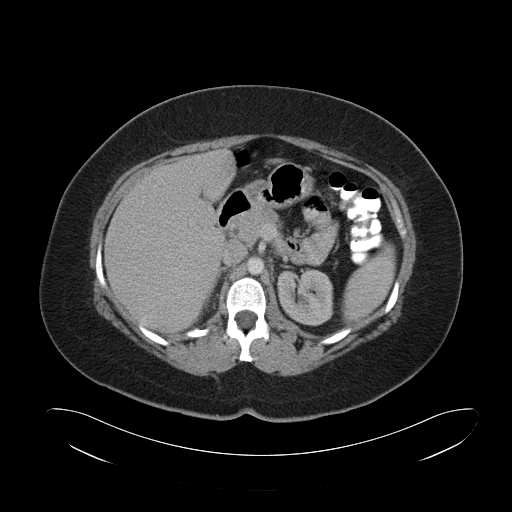
[im 74/94  soft-tissue]
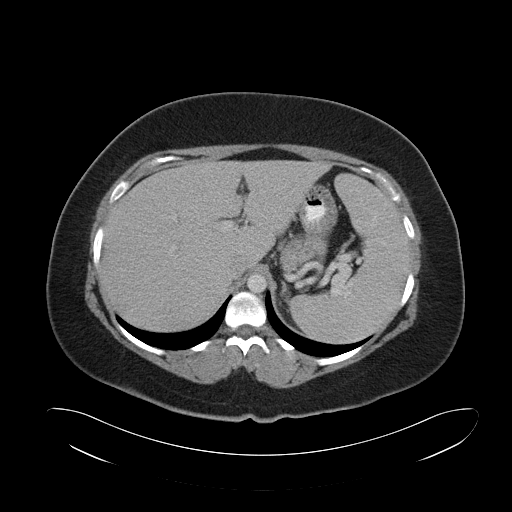
[im 78/94  lung]
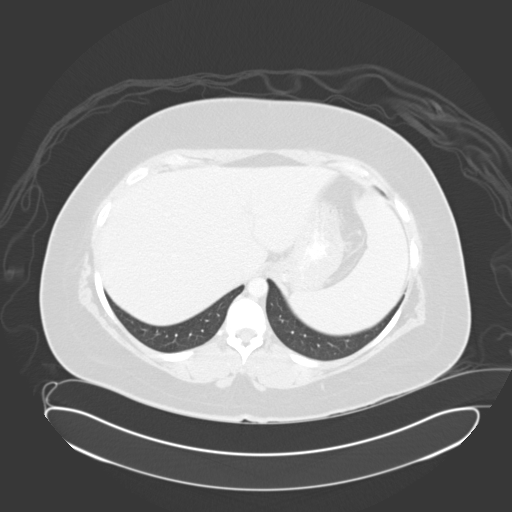
[im 82/94  soft-tissue]
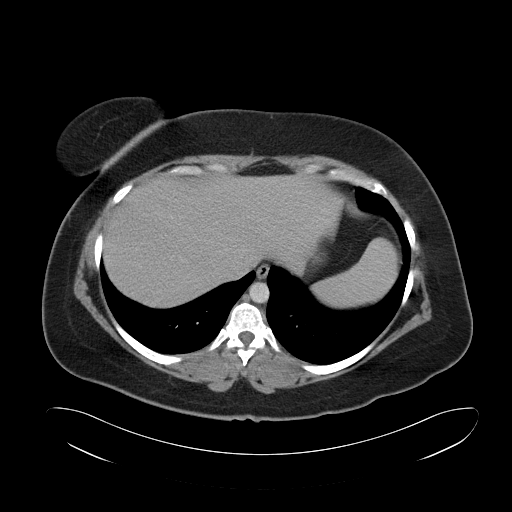
[im 82/94  lung]
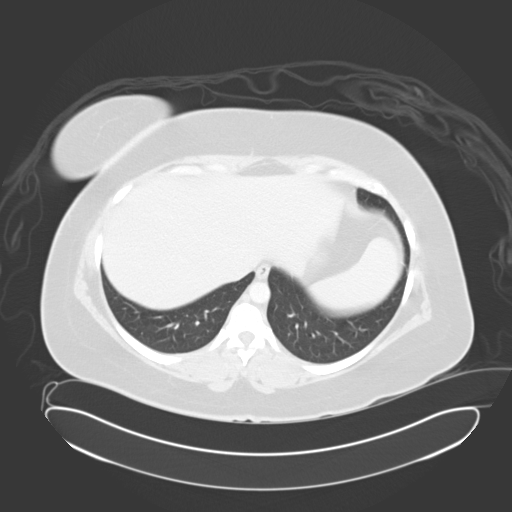
[im 86/94  lung]
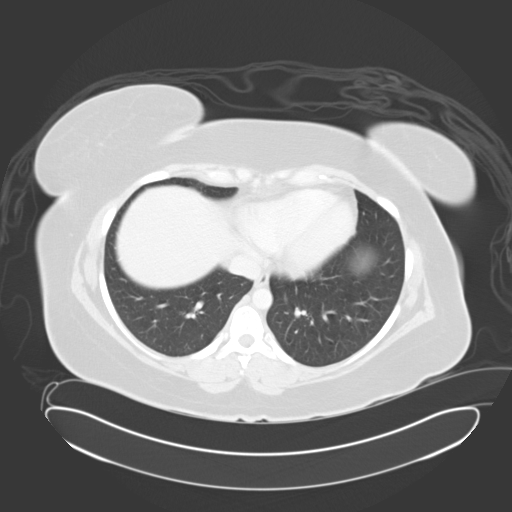
[im 90/94  soft-tissue]
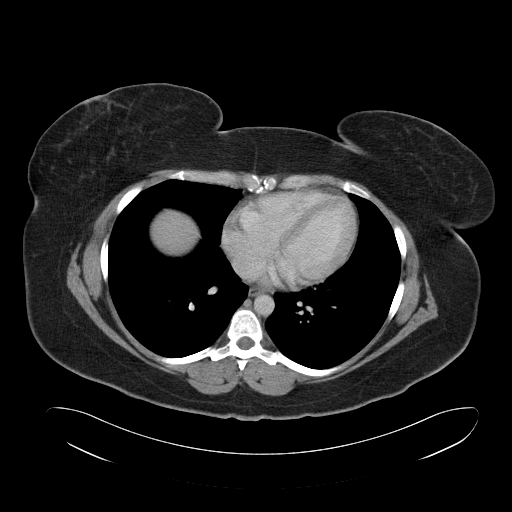
[im 90/94  lung]
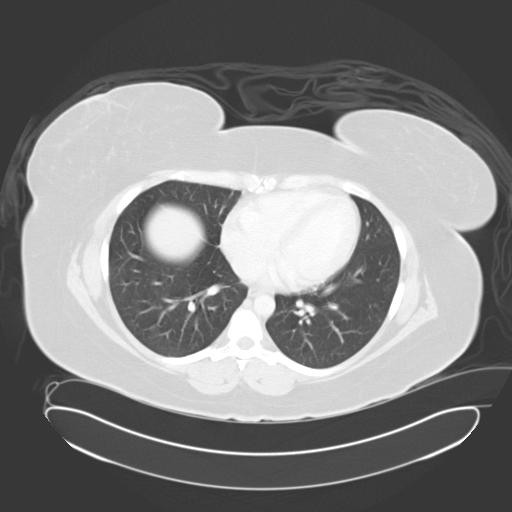

[15 of 32 positions shown; findings below may reference images not displayed]

FINDINGS: Lower chest:  The included lung bases are clear.

Liver: No focal lesion.

Hepatobiliary: Gallbladder surgically absent with clips in
gallbladder fossa. No biliary dilatation.

Pancreas: Normal.

Spleen: Normal.

Adrenal glands: No nodule.

Kidneys: Symmetric renal enhancement. No hydronephrosis. No
perinephric stranding or localizing abnormality.

Stomach/Bowel: Stomach physiologically distended. There are no
dilated or thickened small bowel loops. Small volume of stool
throughout the colon without colonic wall thickening. Oral contrast
is seen throughout the colon, no obstruction. The appendix is
well-visualized and normal.

Vascular/Lymphatic: Mild interval increase in size of multiple
mesenteric lymph nodes in the central and left upper mesenteric, not
enlarged by size criteria. No retroperitoneal adenopathy. Abdominal
aorta is normal in caliber.

Reproductive: Uterus is normal. The left ovary is unchanged in
appearance with a 3.5 cm cyst, there is a fat density component,
likely small dermoid. The right ovary is not seen. Small amount of
free fluid in the pelvis is physiologic.

Bladder: Minimally distended, no wall thickening.

Other: No free air, free fluid, or intra-abdominal fluid collection.
Tiny fat containing umbilical hernia.

Musculoskeletal: There are no acute or suspicious osseous
abnormalities. Stable mild degenerative change in the spine.
IMPRESSION: 1. Mild interval enlargement of central mesenteric lymph nodes, not
enlarged by size criteria. These are likely reactive and may reflect
mesenteric adenitis.
2. Normal appendix.
3. Probable left ovarian dermoid.

## 2017-06-30 ENCOUNTER — Ambulatory Visit (INDEPENDENT_AMBULATORY_CARE_PROVIDER_SITE_OTHER): Payer: 59 | Admitting: Obstetrics and Gynecology

## 2017-06-30 ENCOUNTER — Encounter: Payer: Self-pay | Admitting: Obstetrics and Gynecology

## 2017-06-30 VITALS — BP 93/54 | HR 78 | Ht 60.0 in | Wt 228.5 lb

## 2017-06-30 DIAGNOSIS — R87810 Cervical high risk human papillomavirus (HPV) DNA test positive: Secondary | ICD-10-CM

## 2017-06-30 DIAGNOSIS — E282 Polycystic ovarian syndrome: Secondary | ICD-10-CM | POA: Diagnosis not present

## 2017-06-30 DIAGNOSIS — Z8742 Personal history of other diseases of the female genital tract: Secondary | ICD-10-CM

## 2017-06-30 DIAGNOSIS — Z01419 Encounter for gynecological examination (general) (routine) without abnormal findings: Secondary | ICD-10-CM | POA: Diagnosis not present

## 2017-06-30 DIAGNOSIS — Z809 Family history of malignant neoplasm, unspecified: Secondary | ICD-10-CM

## 2017-06-30 DIAGNOSIS — R102 Pelvic and perineal pain: Secondary | ICD-10-CM

## 2017-06-30 NOTE — Progress Notes (Signed)
GYNECOLOGY ANNUAL PHYSICAL EXAM PROGRESS NOTE  Subjective:    Martha Miranda is a 37 y.o. G32P1102 female who presents for an annual exam. The patient is sexually active.  The patient wears seatbelts: yes. The patient participates in regular exercise: no. Has the patient ever been transfused or tattooed?: no. The patient reports that there is not domestic violence in her life.    The patient has the following complaints today.  1. Patient has a h/o dermoid cyst.  Notes that she is feeling more pelvic pressure, is concerned that it may be getting bigger. Is feeling more pressure on the left, and noting changes in stools as well.   Gynecologic History Menarche age: 60  Patient's last menstrual period was 06/09/2017. Contraception: OCP (estrogen/progesterone) History of STI's: Denies Last Pap: 06/2015. Results were: abnormal, NILM but HPV HR + (no HPV typing).  Denies h/o abnormal pap smears.   OB History  Gravida Para Term Preterm AB Living  2 2 1 1  0 2  SAB TAB Ectopic Multiple Live Births  0 0 0 0 2    # Outcome Date GA Lbr Len/2nd Weight Sex Delivery Anes PTL Lv  2 Term 2016 [redacted]w[redacted]d  7 lb 6 oz (3.345 kg) F CS-LTranv   LIV  1 Preterm 2011 [redacted]w[redacted]d  6 lb 15 oz (3.147 kg) F Vag-Spont   LIV     Complications: Hx of maternal laceration, 4th degree, currently pregnant    Past Medical History:  Diagnosis Date  . Allergy   . Anemia   . Chicken pox   . CPD (cephalo-pelvic disproportion)   . Depression   . Dysmenorrhea   . Genital warts   . GERD (gastroesophageal reflux disease)   . Hydronephrosis   . Hypoglycemia   . Polycystic ovarian syndrome   . Urinary incontinence   . UTI (urinary tract infection)     Past Surgical History:  Procedure Laterality Date  . CESAREAN SECTION    . CHOLECYSTECTOMY  2011  . 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 LAPAROSCOPIC APPENDECTOMY N/A 12/24/2014   Procedure: APPENDECTOMY LAPAROSCOPIC;  Surgeon: Clayburn Pert, MD;   Location: [facility];  Service: General;  Laterality: N/A;  . LAPAROSCOPY  12/24/2014   Procedure: LAPAROSCOPY DIAGNOSTIC;  Surgeon: Clayburn Pert, MD;  Location: [facility];  Service: General;;  . partial oophorectomy     removed 1/2 of right ovary  . RIGHT OOPHORECTOMY  05/19/2013   & bitaleral tubal removal  . TONSILLECTOMY AND ADENOIDECTOMY  1988  . utetral stent placement w/ removal      Family History  Problem Relation Age of Onset  . Arthritis Mother   . Mental illness Mother   . Stroke Father   . High blood pressure Father   . Mental illness Father   . Diabetes Father   . Deep vein thrombosis Father   . Pulmonary embolism Father   . Heart attack Father   . Breast cancer Maternal Aunt 65  . Arthritis Maternal Grandmother   . High blood pressure Maternal Grandmother   . Diabetes Maternal Grandmother   . Dementia Maternal Grandmother   . High blood pressure Maternal Grandfather   . Diabetes Maternal Grandfather   . Arthritis Paternal Grandmother   . High blood pressure Paternal Grandmother   . Kidney disease Paternal Grandmother   . Diabetes Paternal Grandmother   . Mental illness Paternal Grandfather   . Breast cancer Paternal Aunt 14  Social History   Socioeconomic History  . Marital status: Married    Spouse name: Not on file  . Number of children: 1  . Years of education: Not on file  . Highest education level: Not on file  Occupational History  . Not on file  Social Needs  . Financial resource strain: Not on file  . Food insecurity:    Worry: Not on file    Inability: Not on file  . Transportation needs:    Medical: Not on file    Non-medical: Not on file  Tobacco Use  . Smoking status: Never Smoker  . Smokeless tobacco: Never Used  Substance and Sexual Activity  . Alcohol use: No    Alcohol/week: 0.0 oz    Comment: rare  . Drug use: No  . Sexual activity: Yes    Birth control/protection: Pill  Lifestyle  . Physical activity:    Days per  week: Not on file    Minutes per session: Not on file  . Stress: Not on file  Relationships  . Social connections:    Talks on phone: Not on file    Gets together: Not on file    Attends religious service: Not on file    Active member of club or organization: Not on file    Attends meetings of clubs or organizations: Not on file    Relationship status: Not on file  . Intimate partner violence:    Fear of current or ex partner: Not on file    Emotionally abused: Not on file    Physically abused: Not on file    Forced sexual activity: Not on file  Other Topics Concern  . 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 norethindrone-ethinyl estradiol-iron (MICROGESTIN FE 1.5/30) 1.5-30 MG-MCG tablet Take 1 tablet by mouth daily. 3 Package 3  . pantoprazole (PROTONIX) 40 MG tablet TAKE 1 TABLET BY MOUTH DAILY 30 tablet 5  . sertraline (ZOLOFT) 50 MG tablet TAKE 1 TABLET BY MOUTH AT BEDTIME 90 tablet 0  . ranitidine (RANITIDINE 150 MAX STRENGTH) 150 MG tablet      No current facility-administered medications on file prior to visit.     Allergies  Allergen Reactions  . Percocet [Oxycodone-Acetaminophen] Nausea And Vomiting  . Chlorhexidine Rash     Review of Systems Constitutional: negative for chills, fatigue, fevers and sweats Eyes: negative for irritation, redness and visual disturbance Ears, nose, mouth, throat, and face: negative for hearing loss, nasal congestion, snoring and tinnitus Respiratory: negative for asthma, cough, sputum Cardiovascular: negative for chest pain, dyspnea, exertional chest pressure/discomfort, irregular heart beat, palpitations and syncope Gastrointestinal: negative for abdominal pain, change in bowel habits, nausea and vomiting Genitourinary: negative for abnormal menstrual periods, genital lesions,  sexual problems and vaginal discharge, dysuria and urinary incontinence Integument/breast: negative for breast lump, breast tenderness and nipple discharge Hematologic/lymphatic: negative for bleeding and easy bruising Musculoskeletal:negative for back pain and muscle weakness Neurological: negative for dizziness, headaches, vertigo and weakness Endocrine: negative for diabetic symptoms including polydipsia, polyuria and skin dryness Allergic/Immunologic: negative for hay fever and urticaria        Objective:  Blood pressure (!) 93/54, pulse 78, height 5' (1.524 m), weight 228 lb 8 oz (103.6 kg), last menstrual period 06/09/2017. Body  mass index is 44.63 kg/m.    General Appearance:    Alert, cooperative, no distress, appears stated age, morbid obesity  Head:    Normocephalic, without obvious abnormality, atraumatic  Eyes:    PERRL, conjunctiva/corneas clear, EOM's intact, both eyes  Ears:    Normal external ear canals, both ears  Nose:   Nares normal, septum midline, mucosa normal, no drainage or sinus tenderness  Throat:   Lips, mucosa, and tongue normal; teeth and gums normal  Neck:   Supple, symmetrical, trachea midline, no adenopathy; thyroid: no enlargement/tenderness/nodules; no carotid bruit or JVD  Back:     Symmetric, no curvature, ROM normal, no CVA tenderness  Lungs:     Clear to auscultation bilaterally, respirations unlabored  Chest Wall:    No tenderness or deformity   Heart:    Regular rate and rhythm, S1 and S2 normal, no murmur, rub or gallop  Breast Exam:    No tenderness, masses, or nipple abnormality  Abdomen:     Soft, non-tender, bowel sounds active all four quadrants, no masses, no organomegaly.    Genitalia:    Pelvic:external genitalia normal, vagina without lesions, discharge, or tenderness, rectovaginal septum  normal. Cervix normal in appearance, no cervical motion tenderness, no adnexal masses or tenderness.  Uterus normal size, shape, mobile, regular  contours, nontender.  Rectal:    Normal external sphincter.  No hemorrhoids appreciated. Internal exam not done.   Extremities:   Extremities normal, atraumatic, no cyanosis or edema  Pulses:   2+ and symmetric all extremities  Skin:   Skin color, texture, turgor normal, no rashes or lesions  Lymph nodes:   Cervical, supraclavicular, and axillary nodes normal  Neurologic:   CNII-XII intact, normal strength, sensation and reflexes throughout   .  Labs:  Lab Results  Component Value Date   WBC 8.9 06/02/2016   HGB 13.1 06/02/2016   HCT 39.1 06/02/2016   MCV 82.5 06/02/2016   PLT 359.0 06/02/2016    Lab Results  Component Value Date   CREATININE 0.76 01/04/2016   BUN 12 01/04/2016   NA 140 01/04/2016   K 4.0 01/04/2016   CL 102 01/04/2016   CO2 27 01/04/2016    Lab Results  Component Value Date   ALT 15 01/04/2016   AST 17 01/04/2016   ALKPHOS 103 01/04/2016   BILITOT 0.4 01/04/2016    Lab Results  Component Value Date   TSH 1.44 01/04/2016     Assessment:    Healthy female exam.   PCOS Family h/o cancer Morbid obesity H/o ovarian cyst Pelvic pain HPV infection (abnormal pap smear)  Plan:     Blood tests: Had labs with [facility] with PCP.  All normal.  Breast self exam technique reviewed and patient encouraged to perform self-exam monthly. Contraception: OCP (estrogen/progesterone).  Doing well.  Discussed healthy lifestyle modifications. Pap smear repeated today for h/o abnormal pap smear (HPV+) last year.  Pelvic ultrasound ordered for pelvic pain and h/o ovarian cyst (left dermoid cyst 6 cm noted on ultrasound last year).  Family h/o cancer, discussed hereditary cancer screening.  Patient notes that she would desire testing.  [facility] performed today.   Follow up in 1 year.    Rubie Maid, MD [facility]

## 2017-06-30 NOTE — Progress Notes (Signed)
Pt is doing well no problems.

## 2017-07-02 ENCOUNTER — Encounter: Payer: Self-pay | Admitting: Obstetrics and Gynecology

## 2017-07-03 LAB — IGP, COBASHPV16/18
HPV 16: NEGATIVE
HPV 18: NEGATIVE
HPV other hr types: NEGATIVE
PAP Smear Comment: 0

## 2017-07-09 ENCOUNTER — Encounter: Payer: Self-pay | Admitting: Obstetrics and Gynecology

## 2017-07-16 ENCOUNTER — Encounter (INDEPENDENT_AMBULATORY_CARE_PROVIDER_SITE_OTHER): Payer: Self-pay

## 2017-07-17 ENCOUNTER — Ambulatory Visit (INDEPENDENT_AMBULATORY_CARE_PROVIDER_SITE_OTHER): Payer: 59

## 2017-07-17 DIAGNOSIS — Z8742 Personal history of other diseases of the female genital tract: Secondary | ICD-10-CM | POA: Diagnosis not present

## 2017-07-22 ENCOUNTER — Encounter (INDEPENDENT_AMBULATORY_CARE_PROVIDER_SITE_OTHER): Payer: Self-pay

## 2017-07-22 ENCOUNTER — Encounter: Payer: Self-pay | Admitting: Obstetrics and Gynecology

## 2017-09-06 ENCOUNTER — Other Ambulatory Visit: Payer: Self-pay | Admitting: Internal Medicine

## 2018-01-14 ENCOUNTER — Other Ambulatory Visit: Payer: Self-pay | Admitting: Obstetrics and Gynecology

## 2018-01-14 DIAGNOSIS — E282 Polycystic ovarian syndrome: Secondary | ICD-10-CM

## 2018-01-14 DIAGNOSIS — D369 Benign neoplasm, unspecified site: Secondary | ICD-10-CM

## 2018-01-14 DIAGNOSIS — E349 Endocrine disorder, unspecified: Secondary | ICD-10-CM

## 2018-04-07 ENCOUNTER — Other Ambulatory Visit: Payer: Self-pay | Admitting: Obstetrics and Gynecology

## 2018-04-07 DIAGNOSIS — E282 Polycystic ovarian syndrome: Secondary | ICD-10-CM

## 2018-04-07 DIAGNOSIS — D369 Benign neoplasm, unspecified site: Secondary | ICD-10-CM

## 2018-04-07 DIAGNOSIS — E349 Endocrine disorder, unspecified: Secondary | ICD-10-CM

## 2018-04-25 ENCOUNTER — Other Ambulatory Visit: Payer: Self-pay

## 2018-04-25 DIAGNOSIS — D369 Benign neoplasm, unspecified site: Secondary | ICD-10-CM

## 2018-04-25 DIAGNOSIS — E349 Endocrine disorder, unspecified: Secondary | ICD-10-CM

## 2018-04-25 DIAGNOSIS — E282 Polycystic ovarian syndrome: Secondary | ICD-10-CM

## 2018-04-26 ENCOUNTER — Other Ambulatory Visit: Payer: Self-pay

## 2018-04-26 DIAGNOSIS — E349 Endocrine disorder, unspecified: Secondary | ICD-10-CM

## 2018-04-26 DIAGNOSIS — D369 Benign neoplasm, unspecified site: Secondary | ICD-10-CM

## 2018-04-26 DIAGNOSIS — E282 Polycystic ovarian syndrome: Secondary | ICD-10-CM

## 2018-04-26 MED ORDER — NORETHIN ACE-ETH ESTRAD-FE 1.5-30 MG-MCG PO TABS: 1.0000 | ORAL_TABLET | Freq: Every day | ORAL | 1 refills | Status: DC

## 2018-06-02 DIAGNOSIS — E282 Polycystic ovarian syndrome: Secondary | ICD-10-CM

## 2018-06-02 DIAGNOSIS — E349 Endocrine disorder, unspecified: Secondary | ICD-10-CM

## 2018-06-02 DIAGNOSIS — D369 Benign neoplasm, unspecified site: Secondary | ICD-10-CM

## 2018-06-08 MED ORDER — BLISOVI FE 1.5/30 1.5-30 MG-MCG PO TABS: 1.0000 | ORAL_TABLET | Freq: Every day | ORAL | 1 refills | Status: DC

## 2018-10-29 ENCOUNTER — Other Ambulatory Visit: Payer: Self-pay | Admitting: Internal Medicine

## 2018-11-02 NOTE — Telephone Encounter (Signed)
Left message for pt to schedule appt

## 2019-01-02 ENCOUNTER — Other Ambulatory Visit: Payer: Self-pay | Admitting: Obstetrics and Gynecology

## 2019-01-02 DIAGNOSIS — E282 Polycystic ovarian syndrome: Secondary | ICD-10-CM

## 2019-01-02 DIAGNOSIS — E349 Endocrine disorder, unspecified: Secondary | ICD-10-CM

## 2019-01-02 DIAGNOSIS — D369 Benign neoplasm, unspecified site: Secondary | ICD-10-CM

## 2019-01-03 NOTE — Telephone Encounter (Signed)
Patient needs an annual exam scheduled prior to next refill.  

## 2019-03-30 ENCOUNTER — Other Ambulatory Visit: Payer: Self-pay

## 2019-03-30 ENCOUNTER — Encounter: Payer: Self-pay | Admitting: Obstetrics and Gynecology

## 2019-03-30 ENCOUNTER — Other Ambulatory Visit (HOSPITAL_COMMUNITY)
Admission: RE | Admit: 2019-03-30 | Discharge: 2019-03-30 | Disposition: A | Discharge status: Home / Self Care | Payer: Managed Care, Other (non HMO) | Source: Ambulatory Visit | Attending: Obstetrics and Gynecology | Admitting: Obstetrics and Gynecology

## 2019-03-30 ENCOUNTER — Ambulatory Visit (INDEPENDENT_AMBULATORY_CARE_PROVIDER_SITE_OTHER): Payer: Managed Care, Other (non HMO) | Admitting: Obstetrics and Gynecology

## 2019-03-30 VITALS — BP 111/74 | HR 87 | Ht 60.0 in | Wt 243.0 lb

## 2019-03-30 DIAGNOSIS — Z809 Family history of malignant neoplasm, unspecified: Secondary | ICD-10-CM

## 2019-03-30 DIAGNOSIS — E282 Polycystic ovarian syndrome: Secondary | ICD-10-CM | POA: Diagnosis not present

## 2019-03-30 DIAGNOSIS — Z01419 Encounter for gynecological examination (general) (routine) without abnormal findings: Secondary | ICD-10-CM

## 2019-03-30 DIAGNOSIS — D369 Benign neoplasm, unspecified site: Secondary | ICD-10-CM | POA: Diagnosis not present

## 2019-03-30 DIAGNOSIS — R638 Other symptoms and signs concerning food and fluid intake: Secondary | ICD-10-CM | POA: Diagnosis not present

## 2019-03-30 DIAGNOSIS — Z8742 Personal history of other diseases of the female genital tract: Secondary | ICD-10-CM

## 2019-03-30 MED ORDER — BLISOVI FE 1.5/30 1.5-30 MG-MCG PO TABS: 1.0000 | ORAL_TABLET | Freq: Every day | ORAL | 3 refills | Status: DC

## 2019-03-30 NOTE — Progress Notes (Signed)
GYNECOLOGY ANNUAL PHYSICAL EXAM PROGRESS NOTE  Subjective:    Martha Miranda is a 39 y.o. G25P1102 female with h/o PCOS and dermoid cyst who presents for an annual exam. The patient is sexually active.  The patient wears seatbelts: yes. The patient participates in regular exercise: yes (walking daily). Has the patient ever been transfused or tattooed?: no. The patient reports that there is not domestic violence in her life.    The patient has the following complaints today.  1. Does note she is trying to lose weight over the past few months but is having difficulties.  Has changed diet, and increasing activity, increasing water intake.   Loses only a pound per month. Notes this is frustrating. Has a history of PCOS, wonders if this has something to do with it.  Has tried Phentermine in the remote past, noted some mild side effects (persistent elevated heart rate for months after), but used for several years. Goal weight is 180.  2. Notes she is otherwise doing well, has not had any other issues with pain with her left dermoid cyst (last measurement was 5 cm on ultrasound in 2018, has had for years).   Gynecologic History Menarche age: 61  Patient's last menstrual period was 03/09/2019. Contraception: OCP (estrogen/progesterone) History of STI's: Denies Last Pap: 06/2017. Results were: normal.  Prior pap in NILM but HPV HR + (no HPV typing).  Denies h/o abnormal pap smears.    OB History  Gravida Para Term Preterm AB Living  2 2 1 1  0 2  SAB TAB Ectopic Multiple Live Births  0 0 0 0 2    # Outcome Date GA Lbr Len/2nd Weight Sex Delivery Anes PTL Lv  2 Term 2016 [redacted]w[redacted]d  7 lb 6 oz (3.345 kg) F CS-LTranv   LIV  1 Preterm 2011 [redacted]w[redacted]d  6 lb 15 oz (3.147 kg) F Vag-Spont   LIV     Complications: Hx of maternal laceration, 4th degree, currently pregnant    Past Medical History:  Diagnosis Date  . Allergy   . Anemia   . Chicken pox   . CPD (cephalo-pelvic disproportion)   . Depression    . Dysmenorrhea   . Genital warts   . GERD (gastroesophageal reflux disease)   . Hydronephrosis   . Hypoglycemia   . Polycystic ovarian syndrome   . Urinary incontinence   . UTI (urinary tract infection)     Past Surgical History:  Procedure Laterality Date  . CESAREAN SECTION    . CHOLECYSTECTOMY  2011  . 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 LAPAROSCOPIC APPENDECTOMY N/A 12/24/2014   Procedure: APPENDECTOMY LAPAROSCOPIC;  Surgeon: Clayburn Pert, MD;  Location: [facility];  Service: General;  Laterality: N/A;  . LAPAROSCOPY  12/24/2014   Procedure: LAPAROSCOPY DIAGNOSTIC;  Surgeon: Clayburn Pert, MD;  Location: [facility];  Service: General;;  . partial oophorectomy     removed 1/2 of right ovary  . RIGHT OOPHORECTOMY  05/19/2013   & bitaleral tubal removal  . TONSILLECTOMY AND ADENOIDECTOMY  1988  . utetral stent placement w/ removal      Family History  Problem Relation Age of Onset  . Arthritis Mother   . Mental illness Mother   . Stroke Father   . High blood pressure Father   . Mental illness Father   . Diabetes Father   . Deep vein thrombosis Father   . Pulmonary embolism Father   . Heart attack  Father   . Breast cancer Maternal Aunt 58  . Arthritis Maternal Grandmother   . High blood pressure Maternal Grandmother   . Diabetes Maternal Grandmother   . Dementia Maternal Grandmother   . High blood pressure Maternal Grandfather   . Diabetes Maternal Grandfather   . Arthritis Paternal Grandmother   . High blood pressure Paternal Grandmother   . Kidney disease Paternal Grandmother   . Diabetes Paternal Grandmother   . Mental illness Paternal Grandfather   . Breast cancer Paternal Aunt 69    Social History   Socioeconomic History  . Marital status: Married    Spouse name: Not on file  . Number of children: 1  . Years of education: Not on file  . Highest education level: Not on file  Occupational History  . Not on file  Tobacco Use  .  Smoking status: Never Smoker  . Smokeless tobacco: Never Used  Substance and Sexual Activity  . Alcohol use: No    Alcohol/week: 0.0 standard drinks    Comment: rare  . Drug use: No  . Sexual activity: Yes    Birth control/protection: Pill  Other Topics Concern  . 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 Difficulty of Paying Living Expenses: Not on file  Food Insecurity:   . Worried About Charity fundraiser in the Last Year: Not on file  . Ran Out of Food in the Last Year: Not on file  Transportation Needs:   . Lack of Transportation (Medical): Not on file  . Lack of Transportation (Non-Medical): Not on file  Physical Activity:   . Days of Exercise per Week: Not on file  . Minutes of Exercise per Session: Not on file  Stress:   . Feeling of Stress : Not on file  Social Connections:   . Frequency of Communication with Friends and Family: Not on file  . Frequency of Social Gatherings with Friends and Family: Not on file  . Attends Religious Services: Not on file  . Active Member of Clubs or Organizations: Not on file  . Attends Archivist Meetings: Not on file  . Marital Status: Not on file  Intimate Partner Violence:   . Fear of Current or Ex-Partner: Not on file  . Emotionally Abused: Not on file  . Physically Abused: Not on file  . Sexually Abused: Not on file    Current Outpatient Medications on File Prior to Visit  Medication Sig Dispense Refill  . BLISOVI FE 1.5/30 1.5-30 MG-MCG tablet TAKE 1 TABLET BY MOUTH DAILY 84 tablet 0  . pantoprazole (PROTONIX) 40 MG tablet TAKE 1 TABLET BY MOUTH EVERY DAY 30 tablet 1  . sertraline (ZOLOFT) 50 MG tablet TAKE 1 TABLET BY MOUTH AT BEDTIME 90 tablet 0   No current facility-administered medications on file prior to visit.    Allergies  Allergen Reactions  . Percocet  [Oxycodone-Acetaminophen] Nausea And Vomiting  . Chlorhexidine Rash     Review of Systems Constitutional: negative for chills, fatigue, fevers and sweats Eyes: negative for irritation, redness and visual disturbance Ears, nose, mouth, throat, and face: negative for hearing loss, nasal congestion, snoring and tinnitus Respiratory: negative for asthma, cough, sputum Cardiovascular: negative for chest pain, dyspnea, exertional chest pressure/discomfort, irregular heart beat, palpitations and syncope Gastrointestinal:  negative for abdominal pain, change in bowel habits, nausea and vomiting Genitourinary: negative for abnormal menstrual periods, genital lesions, sexual problems and vaginal discharge, dysuria and urinary incontinence Integument/breast: negative for breast lump, breast tenderness and nipple discharge Hematologic/lymphatic: negative for bleeding and easy bruising Musculoskeletal:negative for back pain and muscle weakness Neurological: negative for dizziness, headaches, vertigo and weakness Endocrine: negative for diabetic symptoms including polydipsia, polyuria and skin dryness Allergic/Immunologic: negative for hay fever and urticaria        Objective:  Blood pressure 111/74, pulse 87, height 5' (1.524 m), weight 243 lb (110.2 kg), last menstrual period 03/09/2019. Body mass index is 47.46 kg/m.    General Appearance:    Alert, cooperative, no distress, appears stated age, morbid obesity  Head:    Normocephalic, without obvious abnormality, atraumatic  Eyes:    PERRL, conjunctiva/corneas clear, EOM's intact, both eyes  Ears:    Normal external ear canals, both ears  Nose:   Nares normal, septum midline, mucosa normal, no drainage or sinus tenderness  Throat:   Lips, mucosa, and tongue normal; teeth and gums normal  Neck:   Supple, symmetrical, trachea midline, no adenopathy; thyroid: no enlargement/tenderness/nodules; no carotid bruit or JVD  Back:     Symmetric, no  curvature, ROM normal, no CVA tenderness  Lungs:     Clear to auscultation bilaterally, respirations unlabored  Chest Wall:    No tenderness or deformity   Heart:    Regular rate and rhythm, S1 and S2 normal, no murmur, rub or gallop  Breast Exam:    No tenderness, masses, or nipple abnormality  Abdomen:     Soft, non-tender, bowel sounds active all four quadrants, no masses, no organomegaly.    Genitalia:    Pelvic:external genitalia normal, vagina without lesions, discharge, or tenderness, rectovaginal septum  normal. Cervix normal in appearance, no cervical motion tenderness, no adnexal masses or tenderness.  Uterus normal size, shape, mobile, regular contours, nontender.  Rectal:    Normal external sphincter.  No hemorrhoids appreciated. Internal exam not done.   Extremities:   Extremities normal, atraumatic, no cyanosis or edema  Pulses:   2+ and symmetric all extremities  Skin:   Skin color, texture, turgor normal, no rashes or lesions  Lymph nodes:   Cervical, supraclavicular, and axillary nodes normal  Neurologic:   CNII-XII intact, normal strength, sensation and reflexes throughout   .  Labs:  Has had labs done by PCP within the past year (not in Epic).   Assessment:   Healthy female exam.  PCOS Family h/o cancer Morbid obesity (Increased BMI) H/o ovarian cyst HPV infection (abnormal pap smear)  Plan:    Blood tests: Had labs with PCP.  All normal.  Breast self exam technique reviewed and patient encouraged to perform self-exam monthly. Contraception: OCP (estrogen/progesterone).  Doing well. Refill given.  Discussed healthy lifestyle modifications. Continued to encourage dietary modification and physical activity.  Pap smear repeated today for h/o abnormal pap smear (HPV+). If normal, can continue routine q 3 year screens.   Family h/o cancer, Has had normal genetic screening ([facility]).  To discuss with PCP regarding weight loss management options.   Up to date on flu  vaccine.  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 .  Follow up in 1 year.    Rubie Maid, MD [facility]

## 2019-03-30 NOTE — Patient Instructions (Addendum)
Preventive Care 21-39 Years Old, Female Preventive care refers to visits with your health care provider and lifestyle choices that can promote health and wellness. This includes:  A yearly physical exam. This may also be called an annual well check.  Regular dental visits and eye exams.  Immunizations.  Screening for certain conditions.  Healthy lifestyle choices, such as eating a healthy diet, getting regular exercise, not using drugs or products that contain nicotine and tobacco, and limiting alcohol use. What can I expect for my preventive care visit? Physical exam Your health care provider will check your:  Height and weight. This may be used to calculate body mass index (BMI), which tells if you are at a healthy weight.  Heart rate and blood pressure.  Skin for abnormal spots. Counseling Your health care provider may ask you questions about your:  Alcohol, tobacco, and drug use.  Emotional well-being.  Home and relationship well-being.  Sexual activity.  Eating habits.  Work and work environment.  Method of birth control.  Menstrual cycle.  Pregnancy history. What immunizations do I need?  Influenza (flu) vaccine  This is recommended every year. Tetanus, diphtheria, and pertussis (Tdap) vaccine  You may need a Td booster every 10 years. Varicella (chickenpox) vaccine  You may need this if you have not been vaccinated. Human papillomavirus (HPV) vaccine  If recommended by your health care provider, you may need three doses over 6 months. Measles, mumps, and rubella (MMR) vaccine  You may need at least one dose of MMR. You may also need a second dose. Meningococcal conjugate (MenACWY) vaccine  One dose is recommended if you are age 19-21 years and a first-year college student living in a residence hall, or if you have one of several medical conditions. You may also need additional booster doses. Pneumococcal conjugate (PCV13) vaccine  You may need  this if you have certain conditions and were not previously vaccinated. Pneumococcal polysaccharide (PPSV23) vaccine  You may need one or two doses if you smoke cigarettes or if you have certain conditions. Hepatitis A vaccine  You may need this if you have certain conditions or if you travel or work in places where you may be exposed to hepatitis A. Hepatitis B vaccine  You may need this if you have certain conditions or if you travel or work in places where you may be exposed to hepatitis B. Haemophilus influenzae type b (Hib) vaccine  You may need this if you have certain conditions. You may receive vaccines as individual doses or as more than one vaccine together in one shot (combination vaccines). Talk with your health care provider about the risks and benefits of combination vaccines. What tests do I need?  Blood tests  Lipid and cholesterol levels. These may be checked every 5 years starting at age 20.  Hepatitis C test.  Hepatitis B test. Screening  Diabetes screening. This is done by checking your blood sugar (glucose) after you have not eaten for a while (fasting).  Sexually transmitted disease (STD) testing.  BRCA-related cancer screening. This may be done if you have a family history of breast, ovarian, tubal, or peritoneal cancers.  Pelvic exam and Pap test. This may be done every 3 years starting at age 21. Starting at age 30, this may be done every 5 years if you have a Pap test in combination with an HPV test. Talk with your health care provider about your test results, treatment options, and if necessary, the need for more tests.   Follow these instructions at home: Eating and drinking   Eat a diet that includes fresh fruits and vegetables, whole grains, lean protein, and low-fat dairy.  Take vitamin and mineral supplements as recommended by your health care provider.  Do not drink alcohol if: ? Your health care provider tells you not to drink. ? You are  pregnant, may be pregnant, or are planning to become pregnant.  If you drink alcohol: ? Limit how much you have to 0-1 drink a day. ? Be aware of how much alcohol is in your drink. In the [place] one drink equals one 12 oz bottle of beer (355 mL), one 5 oz glass of wine (148 mL), or one 1 oz glass of hard liquor (44 mL). Lifestyle  Take daily care of your teeth and gums.  Stay active. Exercise for at least 30 minutes on 5 or more days each week.  Do not use any products that contain nicotine or tobacco, such as cigarettes, e-cigarettes, and chewing tobacco. If you need help quitting, ask your health care provider.  If you are sexually active, practice safe sex. Use a condom or other form of birth control (contraception) in order to prevent pregnancy and STIs (sexually transmitted infections). If you plan to become pregnant, see your health care provider for a preconception visit. What's next?  Visit your health care provider once a year for a well check visit.  Ask your health care provider how often you should have your eyes and teeth checked.  Stay up to date on all vaccines. This information is not intended to replace advice given to you by your health care provider. Make sure you discuss any questions you have with your health care provider. Document Revised: 10/22/2017 Document Reviewed: 10/22/2017 Elsevier Patient Education  2020 Elsevier Inc. Breast Self-Awareness Breast self-awareness is knowing how your breasts look and feel. Doing breast self-awareness is important. It allows you to catch a breast problem early while it is still small and can be treated. All women should do breast self-awareness, including women who have had breast implants. Tell your doctor if you notice a change in your breasts. What you need:  A mirror.  A well-lit room. How to do a breast self-exam A breast self-exam is one way to learn what is normal for your breasts and to check for changes. To do a  breast self-exam: Look for changes  1. Take off all the clothes above your waist. 2. Stand in front of a mirror in a room with good lighting. 3. Put your hands on your hips. 4. Push your hands down. 5. Look at your breasts and nipples in the mirror to see if one breast or nipple looks different from the other. Check to see if: ? The shape of one breast is different. ? The size of one breast is different. ? There are wrinkles, dips, and bumps in one breast and not the other. 6. Look at each breast for changes in the skin, such as: ? Redness. ? Scaly areas. 7. Look for changes in your nipples, such as: ? Liquid around the nipples. ? Bleeding. ? Dimpling. ? Redness. ? A change in where the nipples are. Feel for changes  1. Lie on your back on the floor. 2. Feel each breast. To do this, follow these steps: ? Pick a breast to feel. ? Put the arm closest to that breast above your head. ? Use your other arm to feel the nipple area of your breast. Feel   the area with the pads of your three middle fingers by making small circles with your fingers. For the first circle, press lightly. For the second circle, press harder. For the third circle, press even harder. ? Keep making circles with your fingers at the different pressures as you move down your breast. Stop when you feel your ribs. ? Move your fingers a little toward the center of your body. ? Start making circles with your fingers again, this time going up until you reach your collarbone. ? Keep making up-and-down circles until you reach your armpit. Remember to keep using the three pressures. ? Feel the other breast in the same way. 3. Sit or stand in the tub or shower. 4. With soapy water on your skin, feel each breast the same way you did in step 2 when you were lying on the floor. Write down what you find Writing down what you find can help you remember what to tell your doctor. Write down:  What is normal for each breast.  Any  changes you find in each breast, including: ? The kind of changes you find. ? Whether you have pain. ? Size and location of any lumps.  When you last had your menstrual period. General tips  Check your breasts every month.  If you are breastfeeding, the best time to check your breasts is after you feed your baby or after you use a breast pump.  If you get menstrual periods, the best time to check your breasts is 5-7 days after your menstrual period is over.  With time, you will become comfortable with the self-exam, and you will begin to know if there are changes in your breasts. Contact a doctor if you:  See a change in the shape or size of your breasts or nipples.  See a change in the skin of your breast or nipples, such as red or scaly skin.  Have fluid coming from your nipples that is not normal.  Find a lump or thick area that was not there before.  Have pain in your breasts.  Have any concerns about your breast health. Summary  Breast self-awareness includes looking for changes in your breasts, as well as feeling for changes within your breasts.  Breast self-awareness should be done in front of a mirror in a well-lit room.  You should check your breasts every month. If you get menstrual periods, the best time to check your breasts is 5-7 days after your menstrual period is over.  Let your doctor know of any changes you see in your breasts, including changes in size, changes on the skin, pain or tenderness, or fluid from your nipples that is not normal. This information is not intended to replace advice given to you by your health care provider. Make sure you discuss any questions you have with your health care provider. Document Revised: 09/29/2017 Document Reviewed: 09/29/2017 Elsevier Patient Education  [place] for Polycystic Ovary Syndrome Polycystic ovary syndrome (PCOS) is a disorder of the chemicals (hormones) that regulate a woman's  reproductive system, including monthly periods (menstruation). The condition causes important hormones to be out of balance. PCOS can:  Stop your periods or make them irregular.  Cause cysts to develop on your ovaries.  Make it difficult to get pregnant.  Stop your body from responding to the effects of insulin (insulin resistance). Insulin resistance can lead to obesity and diabetes. Changing what you eat can help you manage PCOS and improve  your health. Following a balanced diet can help you lose weight and improve the way that your body uses insulin. What are tips for following this plan?  Follow a balanced diet for meals and snacks. Eat breakfast, lunch, dinner, and one or two snacks every day.  Include protein in each meal and snack.  Choose whole grains instead of products that are made with refined flour.  Eat a variety of foods.  Exercise regularly as told by your health care provider. Aim to do 30 or more minutes of exercise on most days of the week.  If you are overweight or obese: ? Pay attention to how many calories you eat. Cutting down on calories can help you lose weight. ? Work with your health care provider or a diet and nutrition specialist (dietitian) to figure out how many calories you need each day. What foods can I eat?  Fruits Include a variety of colors and types. All fruits are helpful for PCOS. Vegetables Include a variety of colors and types. All vegetables are helpful for PCOS. Grains Whole grains, such as whole wheat. Whole-grain breads, crackers, cereals, and pasta. Unsweetened oatmeal, bulgur, barley, quinoa, and brown rice. Tortillas made from corn or whole-wheat flour. Meats and other proteins Low-fat (lean) proteins, such as fish, chicken, beans, eggs, and tofu. Dairy Low-fat dairy products, such as skim milk, cheese sticks, and yogurt. Beverages Low-fat or fat-free drinks, such as water, low-fat milk, sugar-free drinks, and small amounts of  100% fruit juice. Seasonings and condiments Ketchup. Mustard. Barbecue sauce. Relish. Low-fat or fat-free mayonnaise. Fats and oils Olive oil or canola oil. Walnuts and almonds. The items listed above may not be a complete list of recommended foods and beverages. Contact a dietitian for more options. What foods are not recommended? Foods that are high in calories or fat. Fried foods. Sweets. Products that are made from refined white flour, including white bread, pastries, white rice, and pasta. The items listed above may not be a complete list of foods and beverages to avoid. Contact a dietitian for more information. Summary  PCOS is a hormonal imbalance that affects a woman's reproductive system.  You can help to manage your PCOS by exercising regularly and eating a healthy, varied diet of vegetables, fruit, whole grains, low-fat (lean) protein, and low-fat dairy products.  Changing what you eat can improve the way that your body uses insulin, help your hormones reach normal levels, and help you lose weight. This information is not intended to replace advice given to you by your health care provider. Make sure you discuss any questions you have with your health care provider. Document Revised: 06/02/2018 Document Reviewed: 12/15/2016 Elsevier Patient Education  [place].

## 2019-03-30 NOTE — Progress Notes (Signed)
Pt present for annual exam. Pt stated that she was doing well no problems.  

## 2019-04-01 LAB — CYTOLOGY - PAP
Comment: NEGATIVE
Diagnosis: NEGATIVE
High risk HPV: NEGATIVE

## 2019-04-30 ENCOUNTER — Other Ambulatory Visit: Payer: Self-pay | Admitting: Obstetrics and Gynecology

## 2019-04-30 DIAGNOSIS — D369 Benign neoplasm, unspecified site: Secondary | ICD-10-CM

## 2019-04-30 DIAGNOSIS — E282 Polycystic ovarian syndrome: Secondary | ICD-10-CM

## 2020-03-28 ENCOUNTER — Other Ambulatory Visit: Payer: Self-pay | Admitting: Obstetrics and Gynecology

## 2020-03-28 DIAGNOSIS — E282 Polycystic ovarian syndrome: Secondary | ICD-10-CM

## 2020-03-28 DIAGNOSIS — D369 Benign neoplasm, unspecified site: Secondary | ICD-10-CM

## 2020-04-04 ENCOUNTER — Encounter: Payer: Managed Care, Other (non HMO) | Admitting: Obstetrics and Gynecology

## 2020-04-09 NOTE — Patient Instructions (Signed)

## 2020-04-10 ENCOUNTER — Ambulatory Visit (INDEPENDENT_AMBULATORY_CARE_PROVIDER_SITE_OTHER): Payer: Self-pay | Admitting: Obstetrics and Gynecology

## 2020-04-10 ENCOUNTER — Encounter: Payer: Self-pay | Admitting: Obstetrics and Gynecology

## 2020-04-10 ENCOUNTER — Other Ambulatory Visit: Payer: Self-pay

## 2020-04-10 VITALS — BP 122/78 | HR 97 | Ht 60.0 in | Wt 223.0 lb

## 2020-04-10 DIAGNOSIS — Z833 Family history of diabetes mellitus: Secondary | ICD-10-CM

## 2020-04-10 DIAGNOSIS — Z809 Family history of malignant neoplasm, unspecified: Secondary | ICD-10-CM

## 2020-04-10 DIAGNOSIS — Z3041 Encounter for surveillance of contraceptive pills: Secondary | ICD-10-CM

## 2020-04-10 DIAGNOSIS — Z13220 Encounter for screening for lipoid disorders: Secondary | ICD-10-CM

## 2020-04-10 DIAGNOSIS — R638 Other symptoms and signs concerning food and fluid intake: Secondary | ICD-10-CM

## 2020-04-10 DIAGNOSIS — Z01419 Encounter for gynecological examination (general) (routine) without abnormal findings: Secondary | ICD-10-CM

## 2020-04-10 DIAGNOSIS — N898 Other specified noninflammatory disorders of vagina: Secondary | ICD-10-CM

## 2020-04-10 DIAGNOSIS — Z131 Encounter for screening for diabetes mellitus: Secondary | ICD-10-CM

## 2020-04-10 DIAGNOSIS — E282 Polycystic ovarian syndrome: Secondary | ICD-10-CM

## 2020-04-10 NOTE — Progress Notes (Signed)
Annual Exam-Pt stated that she was doing well no problems.  

## 2020-04-10 NOTE — Progress Notes (Signed)
GYNECOLOGY ANNUAL PHYSICAL EXAM PROGRESS NOTE  Subjective:    Martha Miranda is a 40 y.o. G31P1102 female with h/o PCOS and dermoid cyst who presents for an annual exam. The patient is sexually active.   The patient participates in regular exercise: yes.   The patient has the following complaints today.  1. 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  2. Notes that her daughter (105 year old) was diagnosed with Type 1 DM last year. Has had whole family to make dietary changes.  Notes that she has lost ~ 25 lbs since September.   3. Martha Miranda reports complaints of some vaginal dryness with sex. She notes that she does not like the feeling of the OTC lubricants that she has been utilizing. Wonders if there is something else she can do about this.   Gynecologic History Menarche age: 76 Patient's last menstrual period was 04/04/2020. Notes cycles are very light, essentially only spotting for 3-4 days (previously was heavier and longer).  Contraception: OCP (estrogen/progesterone) History of STI's: Denies Last Pap: 03/30/2019. Results were: normal.  Prior pap in 2017 NILM but HPV HR + (no HPV typing).      Upstream - 04/10/20 1105      Pregnancy Intention Screening   Does the patient want to become pregnant in the next year? No    Does the patient's partner want to become pregnant in the next year? No    Would the patient like to discuss contraceptive options today? No      Contraception Wrap Up   Current Method Oral Contraceptive          The pregnancy intention screening data noted above was reviewed. Potential methods of contraception were discussed. The patient elected to proceed with Oral Contraceptive.    OB History  Gravida Para Term Preterm AB Living  2 2 1 1  0 2  SAB IAB Ectopic Multiple Live Births  0 0 0 0 2    # Outcome Date GA Lbr Len/2nd Weight Sex Delivery Anes PTL Lv  2 Term 2016 [redacted]w[redacted]d  7 lb 6 oz (3.345 kg) F CS-LTranv   LIV  1 Preterm 2011 [redacted]w[redacted]d  6 lb 15 oz  (3.147 kg) F Vag-Spont   LIV     Complications: Hx of maternal laceration, 4th degree, currently pregnant    Past Medical History:  Diagnosis Date  . Allergy   . Anemia   . Chicken pox   . CPD (cephalo-pelvic disproportion)   . Depression   . Dysmenorrhea   . Genital warts   . GERD (gastroesophageal reflux disease)   . Hydronephrosis   . Hypoglycemia   . Polycystic ovarian syndrome   . Urinary incontinence   . UTI (urinary tract infection)     Past Surgical History:  Procedure Laterality Date  . CESAREAN SECTION    . CHOLECYSTECTOMY  2011  . 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 LAPAROSCOPIC APPENDECTOMY N/A 12/24/2014   Procedure: APPENDECTOMY LAPAROSCOPIC;  Surgeon: Clayburn Pert, MD;  Location: [facility] ORS;  Service: General;  Laterality: N/A;  . LAPAROSCOPY  12/24/2014   Procedure: LAPAROSCOPY DIAGNOSTIC;  Surgeon: Clayburn Pert, MD;  Location: [facility] ORS;  Service: General;;  . partial oophorectomy     removed 1/2 of right ovary  . RIGHT OOPHORECTOMY  05/19/2013   & bitaleral tubal removal  . TONSILLECTOMY AND ADENOIDECTOMY  1988  . utetral stent placement w/ removal  Family History  Problem Relation Age of Onset  . Arthritis Mother   . Mental illness Mother   . Stroke Father   . High blood pressure Father   . Mental illness Father   . Diabetes Father   . Deep vein thrombosis Father   . Pulmonary embolism Father   . Heart attack Father   . Breast cancer Maternal Aunt 12  . Arthritis Maternal Grandmother   . High blood pressure Maternal Grandmother   . Diabetes Maternal Grandmother   . Dementia Maternal Grandmother   . High blood pressure Maternal Grandfather   . Diabetes Maternal Grandfather   . Arthritis Paternal Grandmother   . High blood pressure Paternal Grandmother   . Kidney disease Paternal Grandmother   . Diabetes Paternal Grandmother   . Mental illness Paternal Grandfather   . Breast cancer Paternal Aunt 24  . Diabetes  Daughter     Social History   Socioeconomic History  . Marital status: Married    Spouse name: Not on file  . Number of children: 1  . Years of education: Not on file  . Highest education level: Not on file  Occupational History  . Not on file  Tobacco Use  . Smoking status: Never Smoker  . Smokeless tobacco: Never Used  Vaping Use  . Vaping Use: Never used  Substance and Sexual Activity  . Alcohol use: No    Alcohol/week: 0.0 standard drinks    Comment: rare  . Drug use: No  . Sexual activity: Yes    Birth control/protection: Pill  Other Topics Concern  . 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 AUROVELA FE 1.5/30 1.5-30 MG-MCG tablet TAKE 1 TABLET BY MOUTH DAILY 84 tablet 3  . pantoprazole (PROTONIX) 40 MG tablet TAKE 1 TABLET BY MOUTH EVERY DAY 30 tablet 1  . sertraline (ZOLOFT) 50 MG tablet TAKE 1 TABLET BY MOUTH AT BEDTIME 90 tablet 0   No current facility-administered medications on file prior to visit.    Allergies  Allergen Reactions  . Percocet [Oxycodone-Acetaminophen] Nausea And Vomiting  . Chlorhexidine Rash     Review of Systems Constitutional: negative for chills, fatigue, fevers and sweats Eyes: negative for irritation, redness and visual disturbance Ears, nose, mouth, throat, and face: negative for hearing loss, nasal congestion, snoring and tinnitus Respiratory: negative for asthma, cough, sputum Cardiovascular: negative for chest pain, dyspnea, exertional chest pressure/discomfort, irregular heart beat, palpitations and  syncope Gastrointestinal: negative for abdominal pain, change in bowel habits, nausea and vomiting Genitourinary: negative for abnormal menstrual periods, genital lesions, sexual problems and vaginal discharge, dysuria and urinary incontinence Integument/breast: negative for breast lump, breast tenderness and nipple discharge Hematologic/lymphatic: negative for bleeding and easy bruising Musculoskeletal:negative for back pain and muscle weakness Neurological: negative for dizziness, headaches, vertigo and weakness Endocrine: negative for diabetic symptoms including polydipsia, polyuria and skin dryness Allergic/Immunologic: negative for hay fever and urticaria    Objective:  Blood pressure 122/78, pulse 97, height 5' (  1.524 m), weight 223 lb (101.2 kg), last menstrual period 04/04/2020. Body mass index is 43.55 kg/m.  General Appearance:    Alert, cooperative, no distress, appears stated age, morbid obesity  Head:    Normocephalic, without obvious abnormality, atraumatic  Eyes:    PERRL, conjunctiva/corneas clear, EOM's intact, both eyes  Ears:    Normal external ear canals, both ears  Nose:   Nares normal, septum midline, mucosa normal, no drainage or sinus tenderness  Throat:   Lips, mucosa, and tongue normal; teeth and gums normal  Neck:   Supple, symmetrical, trachea midline, no adenopathy; thyroid: no enlargement/tenderness/nodules; no carotid bruit or JVD  Back:     Symmetric, no curvature, ROM normal, no CVA tenderness  Lungs:     Clear to auscultation bilaterally, respirations unlabored  Chest Wall:    No tenderness or deformity   Heart:    Regular rate and rhythm, S1 and S2 normal, no murmur, rub or gallop  Breast Exam:    No tenderness, masses, or nipple abnormality  Abdomen:     Soft, non-tender, bowel sounds active all four quadrants, no masses, no organomegaly.    Genitalia:    Pelvic:external genitalia normal, vagina without lesions, discharge, or tenderness, rectovaginal  septum  normal. Cervix normal in appearance, no cervical motion tenderness, no adnexal masses or tenderness.  Uterus normal size, shape, mobile, regular contours, nontender.  Rectal:    Normal external sphincter.  No hemorrhoids appreciated. Internal exam not done.   Extremities:   Extremities normal, atraumatic, no cyanosis or edema  Pulses:   2+ and symmetric all extremities  Skin:   Skin color, texture, turgor normal, no rashes or lesions  Lymph nodes:   Cervical, supraclavicular, and axillary nodes normal  Neurologic:   CNII-XII intact, normal strength, sensation and reflexes throughout   .  Labs:  Last labs reviewed in Care Everywhere.   Assessment:   Healthy female exam. PCOS Family h/o cancer (Increased BMI) H/o ovarian cyst (dermoid) Family h/o diabetes (Type 1) Vaginal dryness Surveillance of contraceptives  Plan:    - Blood tests: see orders.  - Breast self exam technique reviewed and patient encouraged to perform self-exam monthly. - Contraception: OCP (estrogen/progesterone).  Doing well. Refill given. Discussed that light menses is likely due to long term contraception use.  - Discussed healthy lifestyle modifications. Continued to encourage dietary modification and physical activity. Doing well with weight loss.  - Pap smear normal, up to date. To repeat in 2 years.    - Family h/o cancer, Has had normal genetic screening ([employer]).  - Up to date on flu vaccine.  - COVID vaccination status: completed vaccination series and booster. - Vaginal dryness can be secondary to OCPs, or less likely age-related.  Discussed option of decreasing OCP dosing or trying alternative contraceptive. Patient declines, as she feels the benefits of this dose outweigh side effects.  - H/o left dermoid cyst, stable. Approximately 5 cm on last ultrasound in 2019 (slight increase from 2018 scan). Denies any pain. Will f/u as needed. She has a h/o right oophorectomy, would like to keep  remaining ovary unless otherwise indicated. Discussed use of UberLube (samples given), Joe's H20, or use of coconut oil with intercourse.  - Follow up in 1 year.     Rubie Maid, MD [facility]

## 2020-04-11 LAB — TSH: TSH: 1.09 u[IU]/mL (ref 0.450–4.500)

## 2020-04-11 LAB — CBC
Hematocrit: 43.3 % (ref 34.0–46.6)
Hemoglobin: 14.3 g/dL (ref 11.1–15.9)
MCH: 28.9 pg (ref 26.6–33.0)
MCHC: 33 g/dL (ref 31.5–35.7)
MCV: 88 fL (ref 79–97)
Platelets: 377 10*3/uL (ref 150–450)
RBC: 4.95 x10E6/uL (ref 3.77–5.28)
RDW: 13.3 % (ref 11.7–15.4)
WBC: 9.6 10*3/uL (ref 3.4–10.8)

## 2020-04-11 LAB — COMPREHENSIVE METABOLIC PANEL
ALT: 14 IU/L (ref 0–32)
AST: 18 IU/L (ref 0–40)
Albumin/Globulin Ratio: 1.9 (ref 1.2–2.2)
Albumin: 4.6 g/dL (ref 3.8–4.8)
Alkaline Phosphatase: 88 IU/L (ref 44–121)
BUN/Creatinine Ratio: 20 (ref 9–23)
BUN: 15 mg/dL (ref 6–20)
Bilirubin Total: 0.3 mg/dL (ref 0.0–1.2)
CO2: 22 mmol/L (ref 20–29)
Calcium: 8.9 mg/dL (ref 8.7–10.2)
Chloride: 98 mmol/L (ref 96–106)
Creatinine, Ser: 0.74 mg/dL (ref 0.57–1.00)
GFR calc Af Amer: 119 mL/min/{1.73_m2} (ref >59)
GFR calc non Af Amer: 103 mL/min/{1.73_m2} (ref >59)
Globulin, Total: 2.4 g/dL (ref 1.5–4.5)
Glucose: 73 mg/dL (ref 65–99)
Potassium: 4.2 mmol/L (ref 3.5–5.2)
Sodium: 138 mmol/L (ref 134–144)
Total Protein: 7 g/dL (ref 6.0–8.5)

## 2020-04-11 LAB — HIV ANTIBODY (ROUTINE TESTING W REFLEX): HIV Screen 4th Generation wRfx: NONREACTIVE

## 2020-04-11 LAB — LIPID PANEL
Chol/HDL Ratio: 3.5 ratio (ref 0.0–4.4)
Cholesterol, Total: 186 mg/dL (ref 100–199)
HDL: 53 mg/dL (ref >39)
LDL Chol Calc (NIH): 118 mg/dL — ABNORMAL HIGH (ref 0–99)
Triglycerides: 82 mg/dL (ref 0–149)
VLDL Cholesterol Cal: 15 mg/dL (ref 5–40)

## 2020-04-11 LAB — HEMOGLOBIN A1C
Est. average glucose Bld gHb Est-mCnc: 105 mg/dL
Hgb A1c MFr Bld: 5.3 % (ref 4.8–5.6)

## 2020-04-11 LAB — HEPATITIS C ANTIBODY: Hep C Virus Ab: <0.1 s/co ratio (ref 0.0–0.9)

## 2020-11-08 NOTE — Progress Notes (Signed)
    GYNECOLOGY PROGRESS NOTE  Subjective:    Patient ID: Phillips Odor, female    DOB: 1980-11-04, 40 y.o.   MRN: [id number]  HPI  Patient is a 40 y.o. G62P1102 female who presents for vaginal spotting. She has been having some right lower pelvic pain that comes and goes x 2-3 months. Started out as a tugging sensation, then began waking her up from her sleep. Notes that she has also been having night sweats and insomnia (worse during her cycle week).  Notes cycles have changed from being very light, to very heavy (requiring a tampon q 2-3 hours lasting for a day).  Also noting more mucoid discharge in between her cycle. Is currently on OCPs (has been on same brand x 6 years).  Has h/o right oophorectomy and appendectomy.    The following portions of the patient's history were reviewed and updated as appropriate: allergies, current medications, past family history, past medical history, past social history, past surgical history, and problem list    Review of Systems Pertinent items noted in HPI and remainder of comprehensive ROS otherwise negative.   Objective:    Blood pressure 111/78, pulse 76, resp. rate 16, height 5' 0.5" (1.537 m), weight 228 lb 8 oz (103.6 kg), last menstrual period 10/31/2020.  Body mass index is 43.89 kg/m.  General appearance: alert and no distress Abdomen: soft, mildly tender in RLQ, possible palpable soft mass present on deep palpation. Bowel sounds normal.  Pelvic: deferred Extremities: extremities normal, atraumatic, no cyanosis or edema Neurologic: Grossly normal   Assessment:   1. Pelvic pain   2. Night sweats   3. Hot flashes   4. Left ovarian cyst   5. Breakthrough bleeding on OCPs      Plan:    Unclear cause of pain. Discussed possibility of muscle strain, hernia formation, scar tissue. Will start with pelvic ultrasound to assess, as patient also with h/o 4-5 cm left dermoid cyst, last follow up was 2020.  Night sweats and hot flushes,  also with breakthrough spotting on current contraceptive, unclear cause but likely hormonal. Discussed option of checking hormone levels during hormone-free week of cycle since she is on OCPs. Discussed option of higher dose OCP vs alternative form of contraception.     .A total of 20 minutes were spent face-to-face with the patient during this encounter and over half of that time involved counseling and coordination of care.   Rubie Maid, MD [facility]

## 2020-11-09 ENCOUNTER — Other Ambulatory Visit: Payer: Self-pay

## 2020-11-09 ENCOUNTER — Ambulatory Visit: Payer: Commercial Managed Care - PPO | Admitting: Obstetrics and Gynecology

## 2020-11-09 ENCOUNTER — Encounter: Payer: Self-pay | Admitting: Obstetrics and Gynecology

## 2020-11-09 VITALS — BP 111/78 | HR 76 | Resp 16 | Ht 60.5 in | Wt 228.5 lb

## 2020-11-09 DIAGNOSIS — N921 Excessive and frequent menstruation with irregular cycle: Secondary | ICD-10-CM

## 2020-11-09 DIAGNOSIS — N83202 Unspecified ovarian cyst, left side: Secondary | ICD-10-CM

## 2020-11-09 DIAGNOSIS — R232 Flushing: Secondary | ICD-10-CM | POA: Diagnosis not present

## 2020-11-09 DIAGNOSIS — R102 Pelvic and perineal pain: Secondary | ICD-10-CM

## 2020-11-09 DIAGNOSIS — R61 Generalized hyperhidrosis: Secondary | ICD-10-CM

## 2020-11-26 ENCOUNTER — Ambulatory Visit (INDEPENDENT_AMBULATORY_CARE_PROVIDER_SITE_OTHER): Payer: Commercial Managed Care - PPO

## 2020-11-26 ENCOUNTER — Other Ambulatory Visit: Payer: Self-pay

## 2020-11-26 ENCOUNTER — Other Ambulatory Visit: Payer: Commercial Managed Care - PPO

## 2020-11-26 DIAGNOSIS — N83202 Unspecified ovarian cyst, left side: Secondary | ICD-10-CM | POA: Diagnosis not present

## 2020-11-27 LAB — FSH/LH
FSH: 6.4 m[IU]/mL
LH: 4.9 m[IU]/mL

## 2020-11-27 LAB — PROGESTERONE: Progesterone: 0.1 ng/mL

## 2020-11-27 LAB — ESTRADIOL: Estradiol: 9.5 pg/mL

## 2020-12-07 ENCOUNTER — Telehealth: Payer: Self-pay | Admitting: Obstetrics and Gynecology

## 2020-12-07 NOTE — Telephone Encounter (Signed)
Usually 1-2 weeks

## 2020-12-07 NOTE — Telephone Encounter (Signed)
Pt called today she had sent a mychart message earlier this week, she is wanting to schedule surgery to move cyst. She has discussed with her work and November around 4th would work- I did mention your surgery days are 14th and 28th of November she stated 14th would work as well. She had questions about how soon she would be able to return to work. Please Advise.

## 2020-12-07 NOTE — Telephone Encounter (Signed)
We can place her on the 14th

## 2020-12-31 ENCOUNTER — Encounter: Payer: Self-pay | Admitting: Urgent Care

## 2020-12-31 ENCOUNTER — Other Ambulatory Visit
Admission: RE | Admit: 2020-12-31 | Discharge: 2020-12-31 | Disposition: A | Discharge status: Home / Self Care | Payer: Commercial Managed Care - PPO | Source: Ambulatory Visit | Attending: Obstetrics and Gynecology | Admitting: Obstetrics and Gynecology

## 2020-12-31 ENCOUNTER — Encounter
Admission: RE | Admit: 2020-12-31 | Discharge: 2020-12-31 | Disposition: A | Discharge status: Home / Self Care | Payer: Commercial Managed Care - PPO | Source: Ambulatory Visit | Attending: Obstetrics and Gynecology | Admitting: Obstetrics and Gynecology

## 2020-12-31 ENCOUNTER — Other Ambulatory Visit: Payer: Self-pay

## 2020-12-31 ENCOUNTER — Other Ambulatory Visit: Payer: Self-pay | Admitting: Obstetrics and Gynecology

## 2020-12-31 VITALS — Ht 60.0 in | Wt 227.0 lb

## 2020-12-31 DIAGNOSIS — D649 Anemia, unspecified: Secondary | ICD-10-CM | POA: Insufficient documentation

## 2020-12-31 DIAGNOSIS — Z01818 Encounter for other preprocedural examination: Secondary | ICD-10-CM

## 2020-12-31 DIAGNOSIS — Z01812 Encounter for preprocedural laboratory examination: Secondary | ICD-10-CM | POA: Diagnosis present

## 2020-12-31 DIAGNOSIS — D369 Benign neoplasm, unspecified site: Secondary | ICD-10-CM

## 2020-12-31 HISTORY — DX: Other specified postprocedural states: R11.2

## 2020-12-31 HISTORY — DX: Family history of other specified conditions: Z84.89

## 2020-12-31 HISTORY — DX: Nausea with vomiting, unspecified: Z98.890

## 2020-12-31 LAB — CBC
HCT: 43.5 % (ref 36.0–46.0)
Hemoglobin: 14.1 g/dL (ref 12.0–15.0)
MCH: 28.4 pg (ref 26.0–34.0)
MCHC: 32.4 g/dL (ref 30.0–36.0)
MCV: 87.5 fL (ref 80.0–100.0)
Platelets: 386 10*3/uL (ref 150–400)
RBC: 4.97 MIL/uL (ref 3.87–5.11)
RDW: 13.4 % (ref 11.5–15.5)
WBC: 8.3 10*3/uL (ref 4.0–10.5)
nRBC: 0 % (ref 0.0–0.2)

## 2020-12-31 LAB — TYPE AND SCREEN
ABO/RH(D): O POS
Antibody Screen: NEGATIVE

## 2020-12-31 MED ORDER — HYDROMORPHONE HCL 2 MG PO TABS: 2.0000 mg | ORAL_TABLET | Freq: Four times a day (QID) | ORAL | 0 refills | Status: DC | PRN

## 2020-12-31 MED ORDER — IBUPROFEN 800 MG PO TABS: 800.0000 mg | ORAL_TABLET | Freq: Three times a day (TID) | ORAL | 1 refills | Status: DC | PRN

## 2020-12-31 NOTE — H&P (Signed)
GYNECOLOGY PREOPERATIVE HISTORY AND PHYSICAL   Subjective:  Martha Miranda is a 40 y.o. [id number] here for surgical management of persistent left ovarian cyst, pelvic pain.  No significant preoperative concerns.  Proposed surgery: Left ovarian cystectomy    Pertinent Gynecological History: Menses: flow is moderate and regular every month without intermenstrual spotting Contraception: OCP (estrogen/progesterone) Last pap: normal Date: 03/30/2019   Past Medical History:  Diagnosis Date   Allergy    Anemia    Chicken pox    CPD (cephalo-pelvic disproportion)    Depression    Dysmenorrhea    Genital warts    GERD (gastroesophageal reflux disease)    Hydronephrosis    Hypoglycemia    Polycystic ovarian syndrome    Urinary incontinence    UTI (urinary tract infection)     Past Surgical History:  Procedure Laterality Date   CESAREAN SECTION     CHOLECYSTECTOMY  2011   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     LAPAROSCOPIC APPENDECTOMY N/A 12/24/2014   Procedure: APPENDECTOMY LAPAROSCOPIC;  Surgeon: Clayburn Pert, MD;  Location: [facility];  Service: General;  Laterality: N/A;   LAPAROSCOPY  12/24/2014   Procedure: LAPAROSCOPY DIAGNOSTIC;  Surgeon: Clayburn Pert, MD;  Location: [facility];  Service: General;;   partial oophorectomy     removed 1/2 of right ovary   RIGHT OOPHORECTOMY  05/19/2013   & bitaleral tubal removal   TONSILLECTOMY AND ADENOIDECTOMY  1988   utetral stent placement w/ removal      OB History  Gravida Para Term Preterm AB Living  2 2 1 1   2   SAB IAB Ectopic Multiple Live Births          2    # Outcome Date GA Lbr Len/2nd Weight Sex Delivery Anes PTL Lv  2 Term 2016 [redacted]w[redacted]d  7 lb 6 oz (3.345 kg) F CS-LTranv   LIV  1 Preterm 2011 [redacted]w[redacted]d  6 lb 15 oz (3.147 kg) F Vag-Spont   LIV     Complications: Hx of maternal laceration, 4th degree, currently pregnant    Family History  Problem Relation Age of Onset   Arthritis Mother    Mental  illness Mother    Stroke Father    High blood pressure Father    Mental illness Father    Diabetes Father    Deep vein thrombosis Father    Pulmonary embolism Father    Heart attack Father    Breast cancer Maternal Aunt 55   Arthritis Maternal Grandmother    High blood pressure Maternal Grandmother    Diabetes Maternal Grandmother    Dementia Maternal Grandmother    High blood pressure Maternal Grandfather    Diabetes Maternal Grandfather    Arthritis Paternal Grandmother    High blood pressure Paternal Grandmother    Kidney disease Paternal Grandmother    Diabetes Paternal Grandmother    Mental illness Paternal Grandfather    Breast cancer Paternal Aunt 71   Diabetes Daughter     Social History   Socioeconomic History   Marital status: Married    Spouse name: Not on file   Number of children: 1   Years of education: Not on file   Highest education level: Not on file  Occupational History   Not on file  Tobacco Use   Smoking status: Never   Smokeless tobacco: Never  Vaping Use   Vaping Use: Never used  Substance and Sexual Activity   Alcohol use:  No    Alcohol/week: 0.0 standard drinks    Comment: rare   Drug use: No   Sexual activity: Yes    Birth control/protection: Pill  Other Topics Concern   Not on file  Social History Narrative   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            Social Determinants of Health   Financial Resource Strain: Not on file  Food Insecurity: Not on file  Transportation Needs: Not on file  Physical Activity: Not on file  Stress: Not on file  Social Connections: Not on file  Intimate Partner Violence: Not on file    Current Outpatient Medications on File Prior to Visit  Medication Sig Dispense Refill   AUROVELA FE 1.5/30 1.5-30 MG-MCG tablet TAKE 1 TABLET BY MOUTH DAILY (Patient taking differently: Take 1 tablet by mouth at bedtime.) 84 tablet 3   diphenhydrAMINE (BENADRYL) 25 MG tablet Take 25 mg by mouth at  bedtime as needed for allergies.     pantoprazole (PROTONIX) 40 MG tablet TAKE 1 TABLET BY MOUTH EVERY DAY 30 tablet 1   sertraline (ZOLOFT) 50 MG tablet TAKE 1 TABLET BY MOUTH AT BEDTIME 90 tablet 0   triamcinolone (NASACORT) 55 MCG/ACT AERO nasal inhaler Place 2 sprays into the nose daily as needed (allergies).     No current facility-administered medications on file prior to visit.    Allergies  Allergen Reactions   Percocet [Oxycodone-Acetaminophen] Nausea And Vomiting    Severe vomiting    Chlorhexidine Rash     Review of Systems Constitutional: No recent fever/chills/sweats Respiratory: No recent cough/bronchitis Cardiovascular: No chest pain Gastrointestinal: No recent nausea/vomiting/diarrhea Genitourinary: No UTI symptoms. Right sided pelvic pain, hot flushes Hematologic/lymphatic:No history of coagulopathy or recent blood thinner use    Objective:   Blood pressure 111/78, pulse 76, resp. rate 16, height 5' 0.5" (1.537 m), weight 228 lb 8 oz (103.6 kg), last menstrual period 10/31/2020.  Body mass index is 43.89 kg/m.  CONSTITUTIONAL: Well-developed, well-nourished female in no acute distress. Obesity HENT:  Normocephalic, atraumatic, External right and left ear normal. Oropharynx is clear and moist EYES: Conjunctivae and EOM are normal. Pupils are equal, round, and reactive to light. No scleral icterus.  NECK: Normal range of motion, supple, no masses SKIN: Skin is warm and dry. No rash noted. Not diaphoretic. No erythema. No pallor. NEUROLOGIC: Alert and oriented to person, place, and time. Normal reflexes, muscle tone coordination. No cranial nerve deficit noted. PSYCHIATRIC: Normal mood and affect. Normal behavior. Normal judgment and thought content. CARDIOVASCULAR: Normal heart rate noted, regular rhythm RESPIRATORY: Effort and breath sounds normal, no problems with respiration noted ABDOMEN: Soft, mild tenderness in RLQ, nondistended. PELVIC:  Deferred MUSCULOSKELETAL: Normal range of motion. No edema and no tenderness. 2+ distal pulses.    Labs: No results found for this or any previous visit (from the past 336 hour(s)). Office Visit on 11/09/2020  Component Date Value Ref Range Status   LH 11/26/2020 4.9  mIU/mL Final   Comment:                     Adult Female:                       Follicular phase      2.4 -  12.6  Ovulation phase      14.0 -  95.6                       Luteal phase          1.0 -  11.4                       Postmenopausal        7.7 -  58.5    [employer] 11/26/2020 6.4  mIU/mL Final   Comment:                     Adult Female:                       Follicular phase      3.5 -  12.5                       Ovulation phase       4.7 -  21.5                       Luteal phase          1.7 -   7.7                       Postmenopausal       25.8 - 134.8    Progesterone 11/26/2020 0.1  ng/mL Final   Comment:                      Follicular phase       0.1 -   0.9                      Luteal phase           1.8 -  23.9                      Ovulation phase        0.1 -  12.0                      Pregnant                         First trimester    11.0 -  44.3                         Second trimester   25.4 15-Aug-1980                         Third trimester    58.7 - 214.0                      Postmenopausal         0.0 -   0.1    Estradiol 11/26/2020 9.5  pg/mL Final   Comment:                     Adult Female:                       Follicular phase   86.7 -   166.0  Ovulation phase    85.8 -   498.0                       Luteal phase       43.8 -   211.0                       Postmenopausal     <6.0 -    54.7                     Pregnancy                       1st trimester     215.0 - >4300.0 Roche ECLIA methodology      Imaging Studies: US PELVIC COMPLETE WITH TRANSVAGINAL Patient Name: Martha Miranda DOB: 1980/06/13 MRN: [id number]  ULTRASOUND  REPORT  Location: [facility]  Date of Service: 11/26/2020   Indications:Pelvic Pain Findings:  The uterus is anteverted and measures 4.6 x 2.8 x 4.0 cm. Echo texture is heterogenous without evidence of focal masses. The Endometrium measures 2 mm.  Right Ovary has been surgically removed.  Left Ovary measures 1.8 x 1.0 x 1.5 cm, and is itself appears  normal..Marland KitchenHowever, in the left adnexa, there is a large mass with mixed  echogenicity, and solid and fluid components, measuring 7.5 x 4.1 x 8.2  cm, with interval increase since prior study in 2019.  This may arise from  the Lt ovary, but could not be determined on today's scan. The right adnexa appears normal. There is no free fluid in the cul de sac.  Impression: 1. Normal appearing uterus.   2. S/P Right oopherectomy. 3. Left adnexal mass adjacent to or involving Left ovary.   Recommendations: 1.Clinical correlation with the patient's History and Physical Exam.  Gaspar Cola   I have reviewed this study and agree with documented findings.   Rubie Maid, MD [facility]   Assessment:    Left ovarian cyst Pelvic pain  Plan:   Counseling: Procedure, risks, reasons, benefits and complications (including injury to bowel bladder, major blood vessel, ureter, bleeding, possibility of transfusion, infection, or fistula formation) reviewed in detail. Likelihood of success in alleviating the patient's condition was discussed. Routine postoperative instructions will be reviewed with the patient and her family in detail after surgery.  The patient concurred with the proposed plan, giving informed written consent for the surgery.   Preop testing ordered. Instructions reviewed, including NPO after midnight.   Rubie Maid, MD [facility]

## 2020-12-31 NOTE — H&P (View-Only) (Signed)
GYNECOLOGY PREOPERATIVE HISTORY AND PHYSICAL   Subjective:  Martha Miranda is a 40 y.o. [id number] here for surgical management of persistent left ovarian cyst, pelvic pain.  No significant preoperative concerns.  Proposed surgery: Left ovarian cystectomy    Pertinent Gynecological History: Menses: flow is moderate and regular every month without intermenstrual spotting Contraception: OCP (estrogen/progesterone) Last pap: normal Date: 03/30/2019   Past Medical History:  Diagnosis Date   Allergy    Anemia    Chicken pox    CPD (cephalo-pelvic disproportion)    Depression    Dysmenorrhea    Genital warts    GERD (gastroesophageal reflux disease)    Hydronephrosis    Hypoglycemia    Polycystic ovarian syndrome    Urinary incontinence    UTI (urinary tract infection)     Past Surgical History:  Procedure Laterality Date   CESAREAN SECTION     CHOLECYSTECTOMY  2011   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     LAPAROSCOPIC APPENDECTOMY N/A 12/24/2014   Procedure: APPENDECTOMY LAPAROSCOPIC;  Surgeon: Clayburn Pert, MD;  Location: [facility];  Service: General;  Laterality: N/A;   LAPAROSCOPY  12/24/2014   Procedure: LAPAROSCOPY DIAGNOSTIC;  Surgeon: Clayburn Pert, MD;  Location: [facility];  Service: General;;   partial oophorectomy     removed 1/2 of right ovary   RIGHT OOPHORECTOMY  05/19/2013   & bitaleral tubal removal   TONSILLECTOMY AND ADENOIDECTOMY  1988   utetral stent placement w/ removal      OB History  Gravida Para Term Preterm AB Living  2 2 1 1   2   SAB IAB Ectopic Multiple Live Births          2    # Outcome Date GA Lbr Len/2nd Weight Sex Delivery Anes PTL Lv  2 Term 2016 [redacted]w[redacted]d  7 lb 6 oz (3.345 kg) F CS-LTranv   LIV  1 Preterm 2011 [redacted]w[redacted]d  6 lb 15 oz (3.147 kg) F Vag-Spont   LIV     Complications: Hx of maternal laceration, 4th degree, currently pregnant    Family History  Problem Relation Age of Onset   Arthritis Mother    Mental  illness Mother    Stroke Father    High blood pressure Father    Mental illness Father    Diabetes Father    Deep vein thrombosis Father    Pulmonary embolism Father    Heart attack Father    Breast cancer Maternal Aunt 55   Arthritis Maternal Grandmother    High blood pressure Maternal Grandmother    Diabetes Maternal Grandmother    Dementia Maternal Grandmother    High blood pressure Maternal Grandfather    Diabetes Maternal Grandfather    Arthritis Paternal Grandmother    High blood pressure Paternal Grandmother    Kidney disease Paternal Grandmother    Diabetes Paternal Grandmother    Mental illness Paternal Grandfather    Breast cancer Paternal Aunt 62   Diabetes Daughter     Social History   Socioeconomic History   Marital status: Married    Spouse name: Not on file   Number of children: 1   Years of education: Not on file   Highest education level: Not on file  Occupational History   Not on file  Tobacco Use   Smoking status: Never   Smokeless tobacco: Never  Vaping Use   Vaping Use: Never used  Substance and Sexual Activity   Alcohol use:  No    Alcohol/week: 0.0 standard drinks    Comment: rare   Drug use: No   Sexual activity: Yes    Birth control/protection: Pill  Other Topics Concern   Not on file  Social History Narrative   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            Social Determinants of Health   Financial Resource Strain: Not on file  Food Insecurity: Not on file  Transportation Needs: Not on file  Physical Activity: Not on file  Stress: Not on file  Social Connections: Not on file  Intimate Partner Violence: Not on file    Current Outpatient Medications on File Prior to Visit  Medication Sig Dispense Refill   AUROVELA FE 1.5/30 1.5-30 MG-MCG tablet TAKE 1 TABLET BY MOUTH DAILY (Patient taking differently: Take 1 tablet by mouth at bedtime.) 84 tablet 3   diphenhydrAMINE (BENADRYL) 25 MG tablet Take 25 mg by mouth at  bedtime as needed for allergies.     pantoprazole (PROTONIX) 40 MG tablet TAKE 1 TABLET BY MOUTH EVERY DAY 30 tablet 1   sertraline (ZOLOFT) 50 MG tablet TAKE 1 TABLET BY MOUTH AT BEDTIME 90 tablet 0   triamcinolone (NASACORT) 55 MCG/ACT AERO nasal inhaler Place 2 sprays into the nose daily as needed (allergies).     No current facility-administered medications on file prior to visit.    Allergies  Allergen Reactions   Percocet [Oxycodone-Acetaminophen] Nausea And Vomiting    Severe vomiting    Chlorhexidine Rash     Review of Systems Constitutional: No recent fever/chills/sweats Respiratory: No recent cough/bronchitis Cardiovascular: No chest pain Gastrointestinal: No recent nausea/vomiting/diarrhea Genitourinary: No UTI symptoms. Right sided pelvic pain, hot flushes Hematologic/lymphatic:No history of coagulopathy or recent blood thinner use    Objective:   Blood pressure 111/78, pulse 76, resp. rate 16, height 5' 0.5" (1.537 m), weight 228 lb 8 oz (103.6 kg), last menstrual period 10/31/2020.  Body mass index is 43.89 kg/m.  CONSTITUTIONAL: Well-developed, well-nourished female in no acute distress. Obesity HENT:  Normocephalic, atraumatic, External right and left ear normal. Oropharynx is clear and moist EYES: Conjunctivae and EOM are normal. Pupils are equal, round, and reactive to light. No scleral icterus.  NECK: Normal range of motion, supple, no masses SKIN: Skin is warm and dry. No rash noted. Not diaphoretic. No erythema. No pallor. NEUROLOGIC: Alert and oriented to person, place, and time. Normal reflexes, muscle tone coordination. No cranial nerve deficit noted. PSYCHIATRIC: Normal mood and affect. Normal behavior. Normal judgment and thought content. CARDIOVASCULAR: Normal heart rate noted, regular rhythm RESPIRATORY: Effort and breath sounds normal, no problems with respiration noted ABDOMEN: Soft, mild tenderness in RLQ, nondistended. PELVIC:  Deferred MUSCULOSKELETAL: Normal range of motion. No edema and no tenderness. 2+ distal pulses.    Labs: No results found for this or any previous visit (from the past 336 hour(s)). Office Visit on 11/09/2020  Component Date Value Ref Range Status   LH 11/26/2020 4.9  mIU/mL Final   Comment:                     Adult Female:                       Follicular phase      2.4 -  12.6  Ovulation phase      14.0 -  95.6                       Luteal phase          1.0 -  11.4                       Postmenopausal        7.7 -  58.5    [place] 11/26/2020 6.4  mIU/mL Final   Comment:                     Adult Female:                       Follicular phase      3.5 -  12.5                       Ovulation phase       4.7 -  21.5                       Luteal phase          1.7 -   7.7                       Postmenopausal       25.8 - 134.8    Progesterone 11/26/2020 0.1  ng/mL Final   Comment:                      Follicular phase       0.1 -   0.9                      Luteal phase           1.8 -  23.9                      Ovulation phase        0.1 -  12.0                      Pregnant                         First trimester    11.0 -  44.3                         Second trimester   25.4 12/23/81                         Third trimester    58.7 - 214.0                      Postmenopausal         0.0 -   0.1    Estradiol 11/26/2020 9.5  pg/mL Final   Comment:                     Adult Female:                       Follicular phase   26.9 -   166.0  Ovulation phase    85.8 -   498.0                       Luteal phase       43.8 -   211.0                       Postmenopausal     <6.0 -    54.7                     Pregnancy                       1st trimester     215.0 - >4300.0 Roche ECLIA methodology      Imaging Studies: US PELVIC COMPLETE WITH TRANSVAGINAL Patient Name: Martha Miranda DOB: 1980-02-25 MRN: [id number]  ULTRASOUND  REPORT  Location: [facility]  Date of Service: 11/26/2020   Indications:Pelvic Pain Findings:  The uterus is anteverted and measures 4.6 x 2.8 x 4.0 cm. Echo texture is heterogenous without evidence of focal masses. The Endometrium measures 2 mm.  Right Ovary has been surgically removed.  Left Ovary measures 1.8 x 1.0 x 1.5 cm, and is itself appears  normal..Marland KitchenHowever, in the left adnexa, there is a large mass with mixed  echogenicity, and solid and fluid components, measuring 7.5 x 4.1 x 8.2  cm, with interval increase since prior study in 2019.  This may arise from  the Lt ovary, but could not be determined on today's scan. The right adnexa appears normal. There is no free fluid in the cul de sac.  Impression: 1. Normal appearing uterus.   2. S/P Right oopherectomy. 3. Left adnexal mass adjacent to or involving Left ovary.   Recommendations: 1.Clinical correlation with the patient's History and Physical Exam.  Gaspar Cola   I have reviewed this study and agree with documented findings.   Rubie Maid, MD [facility]   Assessment:    Left ovarian cyst Pelvic pain  Plan:   Counseling: Procedure, risks, reasons, benefits and complications (including injury to bowel bladder, major blood vessel, ureter, bleeding, possibility of transfusion, infection, or fistula formation) reviewed in detail. Likelihood of success in alleviating the patient's condition was discussed. Routine postoperative instructions will be reviewed with the patient and her family in detail after surgery.  The patient concurred with the proposed plan, giving informed written consent for the surgery.   Preop testing ordered. Instructions reviewed, including NPO after midnight.   Rubie Maid, MD [facility]

## 2020-12-31 NOTE — Patient Instructions (Signed)
Your procedure is scheduled on: 01/07/21 Report to [facility]. To find out your arrival time please call [phone] between 1PM - 3PM on 01/04/21.  Remember: Instructions that are not followed completely may result in serious medical risk, up to and including death, or upon the discretion of your surgeon and anesthesiologist your surgery may need to be rescheduled.     _X__ 1. Do not eat food or drink any liquids after midnight the night before your procedure.                 No gum chewing or hard candies.   __X__2.  On the morning of surgery brush your teeth with toothpaste and water, you                 may rinse your mouth with mouthwash if you wish.  Do not swallow any              toothpaste of mouthwash.     _X__ 3.  No Alcohol for 24 hours before or after surgery.   _X__ 4.  Do Not Smoke or use e-cigarettes For 24 Hours Prior to Your Surgery.                 Do not use any chewable tobacco products for at least 6 hours prior to                 surgery.  ____  5.  Bring all medications with you on the day of surgery if instructed.   __X__  6.  Notify your doctor if there is any change in your medical condition      (cold, fever, infections).     Do not wear jewelry, make-up, hairpins, clips or nail polish. Do not wear lotions, powders, or perfumes.  Do not shave body hair 48 hours prior to surgery. Men may shave face and neck. Do not bring valuables to the hospital.    [facility] is not responsible for any belongings or valuables.  Contacts, dentures/partials or body piercings may not be worn into surgery. Bring a case for your contacts, glasses or hearing aids, a denture cup will be supplied. Leave your suitcase in the car. After surgery it may be brought to your room. For patients admitted to the hospital, discharge time is determined by your treatment team.   Patients discharged the day of surgery will not be  allowed to drive home.   Please read over the following fact sheets that you were given:     __X__ Take these medicines the morning of surgery with A SIP OF WATER:    1. pantoprazole (PROTONIX) 40 MG tablet  2.   3.   4.  5.  6.  ____ Fleet Enema (as directed)   ____ Use CHG Soap/SAGE wipes as directed    ____ Use inhalers on the day of surgery  ____ Stop metformin/Janumet/Farxiga 2 days prior to surgery    ____ Take 1/2 of usual insulin dose the night before surgery. No insulin the morning          of surgery.   ____ Stop Blood Thinners Coumadin/Plavix/Xarelto/Pleta/Pradaxa/Eliquis/Effient/Aspirin  on   Or contact your Surgeon, Cardiologist or Medical Doctor regarding  ability to stop your blood thinners  __X__ Stop Anti-inflammatories 7 days before surgery such as Advil, Ibuprofen, Motrin,  BC or Goodies Powder, Naprosyn, Naproxen, Aleve, Aspirin   May use Tylenol  __X__ Stop all herbal supplements, fish oil or vitamin E until after surgery.    ____ Bring C-Pap to the hospital.    Shower with "orange" Dial soap for a couple days before surgery then the night before and the morning of surgery.

## 2021-01-07 ENCOUNTER — Encounter: Payer: Self-pay | Admitting: Obstetrics and Gynecology

## 2021-01-07 ENCOUNTER — Ambulatory Visit: Payer: Commercial Managed Care - PPO | Admitting: Anesthesiology

## 2021-01-07 ENCOUNTER — Encounter
Admission: RE | Disposition: A | Discharge status: Home / Self Care | Payer: Self-pay | Source: Home / Self Care | Attending: Obstetrics and Gynecology

## 2021-01-07 ENCOUNTER — Other Ambulatory Visit: Payer: Self-pay

## 2021-01-07 ENCOUNTER — Ambulatory Visit
Admission: RE | Admit: 2021-01-07 | Discharge: 2021-01-07 | Disposition: A | Discharge status: Home / Self Care | Payer: Commercial Managed Care - PPO | Attending: Obstetrics and Gynecology | Admitting: Obstetrics and Gynecology

## 2021-01-07 DIAGNOSIS — E282 Polycystic ovarian syndrome: Secondary | ICD-10-CM | POA: Diagnosis not present

## 2021-01-07 DIAGNOSIS — N83202 Unspecified ovarian cyst, left side: Secondary | ICD-10-CM | POA: Diagnosis present

## 2021-01-07 DIAGNOSIS — K660 Peritoneal adhesions (postprocedural) (postinfection): Secondary | ICD-10-CM | POA: Diagnosis not present

## 2021-01-07 DIAGNOSIS — R102 Pelvic and perineal pain: Secondary | ICD-10-CM | POA: Diagnosis not present

## 2021-01-07 DIAGNOSIS — K219 Gastro-esophageal reflux disease without esophagitis: Secondary | ICD-10-CM | POA: Diagnosis not present

## 2021-01-07 DIAGNOSIS — Z90721 Acquired absence of ovaries, unilateral: Secondary | ICD-10-CM | POA: Diagnosis not present

## 2021-01-07 DIAGNOSIS — Z01818 Encounter for other preprocedural examination: Secondary | ICD-10-CM

## 2021-01-07 DIAGNOSIS — Z6841 Body Mass Index (BMI) 40.0 and over, adult: Secondary | ICD-10-CM | POA: Diagnosis not present

## 2021-01-07 DIAGNOSIS — D271 Benign neoplasm of left ovary: Secondary | ICD-10-CM | POA: Diagnosis not present

## 2021-01-07 DIAGNOSIS — Z793 Long term (current) use of hormonal contraceptives: Secondary | ICD-10-CM | POA: Diagnosis not present

## 2021-01-07 LAB — ABO/RH: ABO/RH(D): O POS

## 2021-01-07 LAB — POCT PREGNANCY, URINE: Preg Test, Ur: NEGATIVE

## 2021-01-07 SURGERY — OOPHORECTOMY, LAPAROSCOPIC
Anesthesia: General | Laterality: Left

## 2021-01-07 MED ORDER — FENTANYL CITRATE (PF) 100 MCG/2ML IJ SOLN
INTRAMUSCULAR | Status: DC | PRN
  Administered 2021-01-07 (×2): 50 ug via INTRAVENOUS

## 2021-01-07 MED ORDER — APREPITANT 40 MG PO CAPS
ORAL_CAPSULE | ORAL | Status: AC
  Administered 2021-01-07: 40 mg via ORAL
  Filled 2021-01-07: qty 1

## 2021-01-07 MED ORDER — SILVER NITRATE-POT NITRATE 75-25 % EX MISC
CUTANEOUS | Status: AC
  Filled 2021-01-07: qty 10

## 2021-01-07 MED ORDER — DEXMEDETOMIDINE (PRECEDEX) IN NS 20 MCG/5ML (4 MCG/ML) IV SYRINGE
PREFILLED_SYRINGE | INTRAVENOUS | Status: AC
  Filled 2021-01-07: qty 5

## 2021-01-07 MED ORDER — ROCURONIUM BROMIDE 100 MG/10ML IV SOLN
INTRAVENOUS | Status: DC | PRN
  Administered 2021-01-07: 10 mg via INTRAVENOUS
  Administered 2021-01-07: 50 mg via INTRAVENOUS

## 2021-01-07 MED ORDER — ROCURONIUM BROMIDE 10 MG/ML (PF) SYRINGE
PREFILLED_SYRINGE | INTRAVENOUS | Status: AC
  Filled 2021-01-07: qty 10

## 2021-01-07 MED ORDER — FENTANYL CITRATE (PF) 100 MCG/2ML IJ SOLN
INTRAMUSCULAR | Status: AC
  Filled 2021-01-07: qty 2

## 2021-01-07 MED ORDER — KETOROLAC TROMETHAMINE 30 MG/ML IJ SOLN
INTRAMUSCULAR | Status: DC | PRN
  Administered 2021-01-07: 30 mg via INTRAVENOUS

## 2021-01-07 MED ORDER — PROPOFOL 500 MG/50ML IV EMUL
INTRAVENOUS | Status: DC | PRN
  Administered 2021-01-07: 150 ug/kg/min via INTRAVENOUS

## 2021-01-07 MED ORDER — SCOPOLAMINE 1 MG/3DAYS TD PT72
MEDICATED_PATCH | TRANSDERMAL | Status: AC
  Administered 2021-01-07: 1.5 mg
  Filled 2021-01-07: qty 1

## 2021-01-07 MED ORDER — ACETAMINOPHEN 500 MG PO TABS: 1000.0000 mg | ORAL_TABLET | ORAL | Status: AC

## 2021-01-07 MED ORDER — EPHEDRINE 5 MG/ML INJ
INTRAVENOUS | Status: AC
  Filled 2021-01-07: qty 5

## 2021-01-07 MED ORDER — GLYCOPYRROLATE 0.2 MG/ML IJ SOLN
INTRAMUSCULAR | Status: AC
  Filled 2021-01-07: qty 1

## 2021-01-07 MED ORDER — GLYCOPYRROLATE 0.2 MG/ML IJ SOLN
INTRAMUSCULAR | Status: DC | PRN
  Administered 2021-01-07: .2 mg via INTRAVENOUS

## 2021-01-07 MED ORDER — SUGAMMADEX SODIUM 200 MG/2ML IV SOLN
INTRAVENOUS | Status: DC | PRN
  Administered 2021-01-07: 200 mg via INTRAVENOUS

## 2021-01-07 MED ORDER — MEPERIDINE HCL 25 MG/ML IJ SOLN: 6.2500 mg | INTRAMUSCULAR | Status: DC | PRN

## 2021-01-07 MED ORDER — PROPOFOL 10 MG/ML IV BOLUS
INTRAVENOUS | Status: DC | PRN
  Administered 2021-01-07: 150 mg via INTRAVENOUS

## 2021-01-07 MED ORDER — FENTANYL CITRATE (PF) 100 MCG/2ML IJ SOLN: 25.0000 ug | INTRAMUSCULAR | Status: DC | PRN

## 2021-01-07 MED ORDER — ONDANSETRON HCL 4 MG/2ML IJ SOLN
INTRAMUSCULAR | Status: DC | PRN
  Administered 2021-01-07: 4 mg via INTRAVENOUS

## 2021-01-07 MED ORDER — ONDANSETRON HCL 4 MG/2ML IJ SOLN
INTRAMUSCULAR | Status: AC
  Filled 2021-01-07: qty 2

## 2021-01-07 MED ORDER — DEXAMETHASONE SODIUM PHOSPHATE 10 MG/ML IJ SOLN
INTRAMUSCULAR | Status: DC | PRN
  Administered 2021-01-07: 8 mg via INTRAVENOUS

## 2021-01-07 MED ORDER — PROPOFOL 500 MG/50ML IV EMUL
INTRAVENOUS | Status: AC
  Filled 2021-01-07: qty 50

## 2021-01-07 MED ORDER — LIDOCAINE HCL (PF) 2 % IJ SOLN
INTRAMUSCULAR | Status: AC
  Filled 2021-01-07: qty 5

## 2021-01-07 MED ORDER — DEXMEDETOMIDINE (PRECEDEX) IN NS 20 MCG/5ML (4 MCG/ML) IV SYRINGE
PREFILLED_SYRINGE | INTRAVENOUS | Status: DC | PRN
  Administered 2021-01-07: 12 ug via INTRAVENOUS
  Administered 2021-01-07: 8 ug via INTRAVENOUS

## 2021-01-07 MED ORDER — SCOPOLAMINE 1 MG/3DAYS TD PT72: 1.0000 | MEDICATED_PATCH | TRANSDERMAL | Status: DC

## 2021-01-07 MED ORDER — TRAMADOL HCL 50 MG PO TABS
ORAL_TABLET | ORAL | Status: AC
  Filled 2021-01-07: qty 1

## 2021-01-07 MED ORDER — MIDAZOLAM HCL 2 MG/2ML IJ SOLN
INTRAMUSCULAR | Status: DC | PRN
Start: 2021-01-07 — End: 2021-01-07
  Administered 2021-01-07: 2 mg via INTRAVENOUS

## 2021-01-07 MED ORDER — BUPIVACAINE HCL 0.5 % IJ SOLN
INTRAMUSCULAR | Status: DC | PRN
  Administered 2021-01-07: 26 mL

## 2021-01-07 MED ORDER — EPHEDRINE SULFATE 50 MG/ML IJ SOLN
INTRAMUSCULAR | Status: DC | PRN
  Administered 2021-01-07 (×2): 5 mg via INTRAVENOUS

## 2021-01-07 MED ORDER — GABAPENTIN 300 MG PO CAPS
ORAL_CAPSULE | ORAL | Status: AC
  Administered 2021-01-07: 300 mg via ORAL
  Filled 2021-01-07: qty 1

## 2021-01-07 MED ORDER — ONDANSETRON HCL 4 MG/2ML IJ SOLN: 4.0000 mg | Freq: Once | INTRAMUSCULAR | Status: DC | PRN

## 2021-01-07 MED ORDER — ACETAMINOPHEN 500 MG PO TABS
ORAL_TABLET | ORAL | Status: AC
  Administered 2021-01-07: 1000 mg via ORAL
  Filled 2021-01-07: qty 2

## 2021-01-07 MED ORDER — LIDOCAINE HCL (CARDIAC) PF 100 MG/5ML IV SOSY
PREFILLED_SYRINGE | INTRAVENOUS | Status: DC | PRN
  Administered 2021-01-07: 80 mg via INTRAVENOUS

## 2021-01-07 MED ORDER — APREPITANT 40 MG PO CAPS: 40.0000 mg | ORAL_CAPSULE | Freq: Once | ORAL | Status: AC

## 2021-01-07 MED ORDER — BUPIVACAINE HCL (PF) 0.5 % IJ SOLN
INTRAMUSCULAR | Status: AC
  Filled 2021-01-07: qty 30

## 2021-01-07 MED ORDER — LACTATED RINGERS IV SOLN: INTRAVENOUS | Status: DC

## 2021-01-07 MED ORDER — PROPOFOL 10 MG/ML IV BOLUS
INTRAVENOUS | Status: AC
  Filled 2021-01-07: qty 20

## 2021-01-07 MED ORDER — DEXAMETHASONE SODIUM PHOSPHATE 10 MG/ML IJ SOLN
INTRAMUSCULAR | Status: AC
  Filled 2021-01-07: qty 1

## 2021-01-07 MED ORDER — MIDAZOLAM HCL 2 MG/2ML IJ SOLN
INTRAMUSCULAR | Status: AC
  Filled 2021-01-07: qty 2

## 2021-01-07 MED ORDER — GABAPENTIN 300 MG PO CAPS: 300.0000 mg | ORAL_CAPSULE | ORAL | Status: AC

## 2021-01-07 MED ORDER — TRAMADOL HCL 50 MG PO TABS
50.0000 mg | ORAL_TABLET | Freq: Once | ORAL | Status: AC
  Administered 2021-01-07: 50 mg via ORAL

## 2021-01-07 SURGICAL SUPPLY — 43 items
ADH SKN CLS APL DERMABOND .7 (GAUZE/BANDAGES/DRESSINGS) ×1
BLADE SURG SZ11 CARB STEEL (BLADE) ×2 IMPLANT
CATH ROBINSON RED A/P 16FR (CATHETERS) ×2 IMPLANT
CNTNR SPEC 2.5X3XGRAD LEK (MISCELLANEOUS) ×1
CONT SPEC 4OZ STER OR WHT (MISCELLANEOUS) ×1
CONT SPEC 4OZ STRL OR WHT (MISCELLANEOUS) ×1
CONTAINER SPEC 2.5X3XGRAD LEK (MISCELLANEOUS) ×1 IMPLANT
DERMABOND ADVANCED (GAUZE/BANDAGES/DRESSINGS) ×1
DERMABOND ADVANCED .7 DNX12 (GAUZE/BANDAGES/DRESSINGS) ×1 IMPLANT
GAUZE 4X4 16PLY ~~LOC~~+RFID DBL (SPONGE) ×4 IMPLANT
GLOVE SURG ENC MOIS LTX SZ6.5 (GLOVE) ×2 IMPLANT
GLOVE SURG ENC MOIS LTX SZ8 (GLOVE) ×2 IMPLANT
GLOVE SURG UNDER LTX SZ7 (GLOVE) ×2 IMPLANT
GOWN STRL REUS W/ TWL LRG LVL3 (GOWN DISPOSABLE) ×2 IMPLANT
GOWN STRL REUS W/TWL LRG LVL3 (GOWN DISPOSABLE) ×4
GOWN STRL REUS W/TWL XL LVL4 (GOWN DISPOSABLE) ×2 IMPLANT
GRASPER SUT TROCAR 14GX15 (MISCELLANEOUS) ×2 IMPLANT
IRRIGATION STRYKERFLOW (MISCELLANEOUS) ×1 IMPLANT
IRRIGATOR STRYKERFLOW (MISCELLANEOUS) ×2
IV LACTATED RINGERS 1000ML (IV SOLUTION) ×2 IMPLANT
KIT PINK PAD W/HEAD ARE REST (MISCELLANEOUS) ×2
KIT PINK PAD W/HEAD ARM REST (MISCELLANEOUS) ×1 IMPLANT
KIT TURNOVER CYSTO (KITS) ×2 IMPLANT
MANIFOLD NEPTUNE II (INSTRUMENTS) ×2 IMPLANT
NS IRRIG 500ML POUR BTL (IV SOLUTION) ×2 IMPLANT
PACK GYN LAPAROSCOPIC (MISCELLANEOUS) ×2 IMPLANT
PAD OB MATERNITY 4.3X12.25 (PERSONAL CARE ITEMS) ×2 IMPLANT
PAD PREP 24X41 OB/GYN DISP (PERSONAL CARE ITEMS) ×2 IMPLANT
POUCH ENDO CATCH 10MM SPEC (MISCELLANEOUS) ×2 IMPLANT
RETRACTOR RING XSMALL (MISCELLANEOUS) ×1 IMPLANT
RTRCTR WOUND ALEXIS 13CM XS SH (MISCELLANEOUS) ×2
SCRUB EXIDINE 4% CHG 4OZ (MISCELLANEOUS) ×2 IMPLANT
SET TUBE SMOKE EVAC HIGH FLOW (TUBING) ×2 IMPLANT
SLEEVE ENDOPATH XCEL 5M (ENDOMECHANICALS) ×2 IMPLANT
SOL ANTI-FOG 6CC FOG-OUT (MISCELLANEOUS) ×1 IMPLANT
SOL FOG-OUT ANTI-FOG 6CC (MISCELLANEOUS) ×1
SUT VIC AB 3-0 SH 27 (SUTURE)
SUT VIC AB 3-0 SH 27X BRD (SUTURE) IMPLANT
SUT VICRYL 0 AB UR-6 (SUTURE) ×8 IMPLANT
SYR 50ML LL SCALE MARK (SYRINGE) ×2 IMPLANT
TROCAR ENDO BLADELESS 11MM (ENDOMECHANICALS) ×2 IMPLANT
TROCAR XCEL NON-BLD 5MMX100MML (ENDOMECHANICALS) ×2 IMPLANT
WATER STERILE IRR 500ML POUR (IV SOLUTION) ×2 IMPLANT

## 2021-01-07 NOTE — Anesthesia Preprocedure Evaluation (Signed)
Anesthesia Evaluation  Patient identified by MRN, date of birth, ID band Patient awake    Reviewed: Allergy & Precautions, NPO status , Patient's Chart, lab work & pertinent test results  History of Anesthesia Complications (+) PONV, Family history of anesthesia reaction and history of anesthetic complications  Airway Mallampati: II  TM Distance: >3 FB Neck ROM: Full    Dental no notable dental hx.    Pulmonary neg pulmonary ROS,    Pulmonary exam normal        Cardiovascular negative cardio ROS Normal cardiovascular exam     Neuro/Psych negative neurological ROS  negative psych ROS   GI/Hepatic Neg liver ROS, GERD  Medicated and Controlled,  Endo/Other  Morbid obesity  Renal/GU Renal disease  negative genitourinary   Musculoskeletal negative musculoskeletal ROS (+)   Abdominal   Peds negative pediatric ROS (+)  Hematology negative hematology ROS (+) anemia ,   Anesthesia Other Findings Allergy    Anemia    Chicken pox    CPD (cephalo-pelvic disproportion)  Depression    Dysmenorrhea    Family history of adverse reaction to anesthesia  PONV  Genital warts    GERD (gastroesophageal reflux disease) Hydronephrosis    Hypoglycemia    Polycystic ovarian syndrome   PONV (postoperative nausea and vomiting) Urinary incontinence    UTI (urinary tract infection)       Reproductive/Obstetrics negative OB ROS                            Anesthesia Physical Anesthesia Plan  ASA: 3  Anesthesia Plan: General   Post-op Pain Management:    Induction: Intravenous  PONV Risk Score and Plan: 4 or greater and Propofol infusion, Scopolamine patch - Pre-op, Ondansetron and Dexamethasone  Airway Management Planned: Oral ETT  Additional Equipment:   Intra-op Plan:   Post-operative Plan: Extubation in OR  Informed Consent: I have reviewed the patients History and Physical, chart, labs  and discussed the procedure including the risks, benefits and alternatives for the proposed anesthesia with the patient or authorized representative who has indicated his/her understanding and acceptance.       Plan Discussed with: CRNA, Anesthesiologist and Surgeon  Anesthesia Plan Comments:         Anesthesia Quick Evaluation

## 2021-01-07 NOTE — Discharge Instructions (Addendum)
AMBULATORY SURGERY  DISCHARGE INSTRUCTIONS   The drugs that you were given will stay in your system until tomorrow so for the next 24 hours you should not:  Drive an automobile Make any legal decisions Drink any alcoholic beverage   You may resume regular meals tomorrow.  Today it is better to start with liquids and gradually work up to solid foods.  You may eat anything you prefer, but it is better to start with liquids, then soup and crackers, and gradually work up to solid foods.   Please notify your doctor immediately if you have any unusual bleeding, trouble breathing, redness and pain at the surgery site, drainage, fever, or pain not relieved by medication.    Additional Instructions:    TYLENOL 1,000 MG GIVEN AT [place] AT 10:21 AM                                                TRAMADOL 50MG  GIVEN AT [place] AT 2:23 PM                                                 TORADOL (NON-STERROIDAL ANTIINFLAMMATORY MEDICATION) TGIVEN AT [place] AT 1:30 PM - MAY HAVE IBUPROFEN IN 8 HRS (9:30 PM) IF NEEDED.   Pain medication (dilaudid & ibuprofen already prescribed and picked up from pharmacy prior to day of surgery, per patient)      Please contact your physician with any problems or Same Day Surgery at [phone], Monday through Friday 6 am to 4 pm, or [place] at [facility] number at [phone].

## 2021-01-07 NOTE — Anesthesia Procedure Notes (Signed)
Procedure Name: Intubation Date/Time: 01/07/2021 11:22 AM Performed by: Hedda Slade, CRNA Pre-anesthesia Checklist: Patient identified, Patient being monitored, Timeout performed, Emergency Drugs available and Suction available Patient Re-evaluated:Patient Re-evaluated prior to induction Oxygen Delivery Method: Circle system utilized Preoxygenation: Pre-oxygenation with 100% oxygen Induction Type: IV induction Ventilation: Mask ventilation without difficulty Laryngoscope Size: 3 and McGraph Grade View: Grade I Tube type: Oral Tube size: 7.0 mm Number of attempts: 1 Airway Equipment and Method: Stylet Placement Confirmation: ETT inserted through vocal cords under direct vision, positive ETCO2 and breath sounds checked- equal and bilateral Secured at: 21 cm Tube secured with: Tape Dental Injury: Teeth and Oropharynx as per pre-operative assessment

## 2021-01-07 NOTE — Interval H&P Note (Signed)
History and Physical Interval Note:  01/07/2021 11:02 AM  Martha Miranda  has presented today for surgery, with the diagnosis of Left Adnexal Cyst, Pelvic Pain.  The various methods of treatment have been discussed with the patient and family. After consideration of risks, benefits and other options for treatment, the patient has consented to  Procedure(s): LAPAROSCOPIC OVARIAN CYSTECTOMY (Left), POSSIBLE LAPAROSCOPIC OOPHORECTOMY (Left) as a surgical intervention.  The patient's history has been reviewed, patient examined, no change in status, stable for surgery.  I have reviewed the patient's chart and labs.  Questions were answered to the patient's satisfaction.     Rubie Maid, MD

## 2021-01-07 NOTE — Op Note (Addendum)
Procedure(s): LAPAROSCOPIC OOPHORECTOMY, LYSIS OF ADHESIONS Procedure Note  Martha Miranda Miranda female 40 y.o. 01/07/2021  Indications: Martha Miranda patient is a 40 y.o. G13P1102 female with large left ovarian cyst (~7.5 x 4.1 x 8.2 cm on 11/26/2020 ultrasound), pelvic pain. Previous history of right oophorectomy due to ovarian cyst (dermoid).   Pre-operative Diagnosis: Left ovarian cyst, pelvic pain  Post-operative Diagnosis: Same (suspected dermoid cyst), and abdominal adhesions  Surgeon: Rubie Maid, MD  Assistants:  Martha Miranda Fend, MD. An experienced assistant was required given Martha Miranda standard of surgical care given Martha Miranda complexity of Martha Miranda case.  This assistant was needed for exposure, dissection, suctioning, retraction, instrument exchange, and for overall help during Martha Miranda procedure.  Anesthesia: General endotracheal anesthesia  Findings: Martha Miranda uterus was sounded to 5 Fallopian tubes bilaterally appeared normal.  Left ovary with large cyst (dermoid), multi-lobed.   Small possible remnant of right ovary vs pedicle Dense omental adhesions to Martha Miranda anterior abdominal wall Normal appearing upper abdomen.   Procedure Details: Martha Miranda patient was seen in Martha Miranda Holding Room. Martha Miranda risks, benefits, complications, treatment options, and expected outcomes were discussed with Martha Miranda patient.  Martha Miranda patient concurred with Martha Miranda proposed plan, giving informed consent.  Martha Miranda site of surgery properly noted/marked. Martha Miranda patient was taken to Martha Miranda Operating Room, identified as Martha Miranda Miranda and Martha Miranda procedure verified as Procedure(s) (LRB): Martha Miranda Miranda (Left).   She was placed under general anesthesia without difficulty. She was then placed in Martha Miranda dorsal lithotomy position, and was prepped and draped in a sterile manner. A Time Out was held and Martha Miranda above information confirmed. A straight catheterization was performed. A sterile speculum was inserted into Martha Miranda vagina and Martha Miranda cervix was grasped at Martha Miranda  anterior lip using a single-toothed tenaculum.  Martha Miranda uterus was sounded to 5 cm, and a Hulka clamp was placed for uterine manipulation.  Martha Miranda speculum and tenaculum were then removed. Attention was turned to Martha Miranda abdomen where an umbilical incision was made with Martha Miranda scalpel.  Martha Miranda Optiview 5-mm trocar and sleeve were then advanced without difficulty with Martha Miranda laparoscope under direct visualization into Martha Miranda abdomen.  Martha Miranda abdomen was then insufflated with carbon dioxide gas and adequate pneumoperitoneum was obtained. An 11-mm left lower quadrant port and an 5-mm right lower quadrant port were then placed under direct visualization.  A survey of Martha Miranda patient's pelvis and abdomen revealed Martha Miranda findings as above.  Martha Miranda Harmonic device was used to lyse Martha Miranda omental adhesions to Martha Miranda anterior abdominal wall in order to gain better visualization of Martha Miranda lower abdomen and pelvis (approximately 20 minutes of total operative time). Martha Miranda cyst was identified and noted to have some fluctuance. An attempt was made to drain Martha Miranda cyst with a laparoscopic needle aspirator however there was no return of fluid in Martha Miranda syringe.  Martha Miranda Harmonic scalpel was then used to incise a small area of Martha Miranda cyst with caseous material present. A miniscule portion of normal ovarian tissue was identified however majority of Martha Miranda cyst was noted to be completely contiguous with they ovary. Martha Miranda decision was made to perform an oophorectomy instead of cystectomy due to Martha Miranda significant involvement of Martha Miranda cyst and no clearly identifiable plane. On Martha Miranda right side, a possible small segment of Martha Miranda right ovary remained. Martha Miranda left infundibulopelvic ligament was clamped and transected with Martha Miranda Harmonic device. An Endocatch bag was then inserted into Martha Miranda 11 mm trochar and Martha Miranda left ovary and cyst were removed. Martha Miranda skin and fascial incision were extended to be able to remove  Martha Miranda specimen. Martha Miranda fascia of Martha Miranda incision was then grasped with Kocher clamps and approximated using 0-Vicryl in  a running fashion.    Martha Miranda pneumoperitoneum was then re-established, and Martha Miranda laparoscope was then introduced once again into Martha Miranda abdominal cavity.  A final survey was performed, where good hemostasis was noted on Martha Miranda left side.  Visualization of Martha Miranda previously closed 11 mm extended port site noted an area of persistent fascial separation. Martha Miranda cone introducer was placed and a PMI device was used to close Martha Miranda open segment of Martha Miranda fascia.  Good hemostasis was noted throughout.  All trocars were removed under direct visualization, and Martha Miranda abdomen which was desufflated.    Martha Miranda subcutaneous fat layer was approximated using 3-0 Vicryl in a running fashion. All skin incisions were closed with 4-0 Monocryl subcuticular stitches. Martha Miranda incisions were injected with a total of 20 ml of 0.5% Sensorcaine.Martha Miranda patient tolerated Martha Miranda procedure well.  All instruments, needles, and sponge counts were correct x 2. Martha Miranda patient was taken to Martha Miranda recovery room awake, extubated and in stable condition.    Estimated Blood Loss:  20 ml      Drains: straight catheterization prior to procedure with  20 ml of clear urine         Total IV Fluids: 800 ml  Specimens: Left ovary with cyst         Implants: None         Complications:  None; patient tolerated Martha Miranda procedure well.         Disposition: PACU - hemodynamically stable.         Condition: stable   Rubie Maid, MD [facility]

## 2021-01-07 NOTE — Anesthesia Postprocedure Evaluation (Signed)
Anesthesia Post Note  Patient: Museum/gallery conservator G Meter  Procedure(s) Performed: LAPAROSCOPIC OOPHORECTOMY (Left)  Patient location during evaluation: PACU Anesthesia Type: General Level of consciousness: awake and alert, awake and oriented Pain management: pain level controlled Vital Signs Assessment: post-procedure vital signs reviewed and stable Respiratory status: spontaneous breathing, nonlabored ventilation and respiratory function stable Cardiovascular status: blood pressure returned to baseline and stable Postop Assessment: no apparent nausea or vomiting Anesthetic complications: no   No notable events documented.   Last Vitals:  Vitals:   01/07/21 1415 01/07/21 1423  BP: (!) 91/56   Pulse: 71 73  Resp: 19 19  Temp:    SpO2: 98% 95%    Last Pain:  Vitals:   01/07/21 1423  TempSrc:   PainSc: 3                  Phill Mutter

## 2021-01-07 NOTE — Transfer of Care (Signed)
Immediate Anesthesia Transfer of Care Note  Patient: Martha Miranda  Procedure(s) Performed: LAPAROSCOPIC OOPHORECTOMY (Left)  Patient Location: PACU  Anesthesia Type:General  Level of Consciousness: drowsy  Airway & Oxygen Therapy: Patient Spontanous Breathing and Patient connected to face mask oxygen  Post-op Assessment: Report given to RN and Post -op Vital signs reviewed and stable  Post vital signs: Reviewed  Last Vitals:  Vitals Value Taken Time  BP 94/54 01/07/21 1352  Temp    Pulse 62 01/07/21 1356  Resp 32 01/07/21 1356  SpO2 100 % 01/07/21 1356  Vitals shown include unvalidated device data.  Last Pain:  Vitals:   01/07/21 1017  TempSrc: Oral  PainSc: 0-No pain         Complications: No notable events documented.

## 2021-01-08 LAB — SURGICAL PATHOLOGY

## 2021-01-14 NOTE — Progress Notes (Signed)
    OBSTETRICS/GYNECOLOGY POST-OPERATIVE CLINIC VISIT  Subjective:     Martha Miranda is a 40 y.o. female who presents to the clinic 1 week status post left laparoscopy oophorectomy for pelvic pain. Eating a regular diet without difficulty. Bowel movements are normal. Pain is controlled without any medications. Reports that she is no longer feeling the urinary symptoms (increased urge, nocturia). Notes itching at incision sites.   The following portions of the patient's history were reviewed and updated as appropriate: allergies, current medications, past family history, past medical history, past social history, past surgical history, and problem list.  Review of Systems A comprehensive review of systems was negative except for: Respiratory: positive for cough and sputum. Sputum is green. Has tried Mucinex-DM, drinking more water. Does have issues with sinus infections in the past. Had to stop her sinus medications prior to surgery.    Objective:   BP 108/66   Pulse 86   Resp 16   Ht 5\' 5"  (1.651 m)   Wt 230 lb 11.2 oz (104.6 kg)   LMP 12/26/2020 (Exact Date)   BMI 38.39 kg/m  Body mass index is 38.39 kg/m.  General:  alert and no distress  Abdomen: soft, bowel sounds active, non-tender  Incision:   healing well, no drainage, no erythema, no hernia, no swelling, no dehiscence, incision well approximated. Possible hematoma vs seroma and swelling at left incision site, non-tender.     Pathology:   - LEFT OVARY WITH CYST; OOPHORECTOMY:  - MATURE CYSTIC TERATOMA.  - NEGATIVE FOR MALIGNANCY.   Assessment:   Patient s/p Left oophorectomy for large dermoid cyst Doing well postoperatively. 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 Continue any current medications as instructed by provider.  Advised that she will now take her OCPs continuously (skip placebo pills) as she is surgical menopausal.  2. Wound care discussed.  Advised on ice packs to  possible hematoma/seroma. Patient reports it has decreased in size since the surgery.  3. Operative findings again reviewed. Pathology report discussed. 4. Activity restrictions: no lifting more than 10-15 pounds for an additional week. 5. Anticipated return to work:  01/18/2021 . 6. Will give course of Amoxicillin for sinusitis as patient has a history of recurrence.  Also can resume her nasal medications at this time.  7. Follow up: 3  months  for annual exam.    Rubie Maid, MD [facility]

## 2021-01-15 ENCOUNTER — Encounter: Payer: Self-pay | Admitting: Obstetrics and Gynecology

## 2021-01-15 ENCOUNTER — Other Ambulatory Visit: Payer: Self-pay

## 2021-01-15 ENCOUNTER — Ambulatory Visit (INDEPENDENT_AMBULATORY_CARE_PROVIDER_SITE_OTHER): Payer: Commercial Managed Care - PPO | Admitting: Obstetrics and Gynecology

## 2021-01-15 VITALS — BP 108/66 | HR 86 | Resp 16 | Ht 65.0 in | Wt 230.7 lb

## 2021-01-15 DIAGNOSIS — T792XXA Traumatic secondary and recurrent hemorrhage and seroma, initial encounter: Secondary | ICD-10-CM

## 2021-01-15 DIAGNOSIS — J0191 Acute recurrent sinusitis, unspecified: Secondary | ICD-10-CM

## 2021-01-15 DIAGNOSIS — E8940 Asymptomatic postprocedural ovarian failure: Secondary | ICD-10-CM

## 2021-01-15 DIAGNOSIS — Z90721 Acquired absence of ovaries, unilateral: Secondary | ICD-10-CM

## 2021-01-15 DIAGNOSIS — D369 Benign neoplasm, unspecified site: Secondary | ICD-10-CM

## 2021-01-15 DIAGNOSIS — Z4889 Encounter for other specified surgical aftercare: Secondary | ICD-10-CM

## 2021-01-15 MED ORDER — NORETHIN ACE-ETH ESTRAD-FE 1.5-30 MG-MCG PO TABS: 1.0000 | ORAL_TABLET | Freq: Every day | ORAL | 3 refills | Status: DC

## 2021-01-15 MED ORDER — AMOXICILLIN 500 MG PO CAPS: 500.0000 mg | ORAL_CAPSULE | Freq: Three times a day (TID) | ORAL | 2 refills | Status: DC

## 2021-01-16 ENCOUNTER — Encounter: Payer: Commercial Managed Care - PPO | Admitting: Obstetrics and Gynecology

## 2021-03-27 ENCOUNTER — Encounter: Payer: Self-pay | Admitting: Obstetrics and Gynecology

## 2022-02-07 ENCOUNTER — Telehealth: Payer: Self-pay

## 2022-02-07 NOTE — Telephone Encounter (Signed)
Received fax for [facility] RF from [facility], Dr. Marcelline Mates approved and fax has been sent back. Called pt to let her know, no answer, LVMTRC. If patient calls back, let her know RF sent and she is due for her annual.

## 2022-04-14 NOTE — Progress Notes (Unsigned)
GYNECOLOGY ANNUAL PHYSICAL EXAM PROGRESS NOTE  Subjective:    Martha Miranda is a 42 y.o. G19P1102 female who presents for an annual exam. The patient has no complaints today. The patient {is/is not/has never been:13135} sexually active. The patient participates in regular exercise: {yes/no/not asked:9010}. Has the patient ever been transfused or tattooed?: {yes/no/not asked:9010}. The patient reports that there {is/is not:9024} domestic violence in her life.    Menstrual History: Menarche age: 68 No LMP recorded.     Gynecologic History:  Contraception: OCP (estrogen/progesterone) History of STI's: Denies Last Pap: 03/30/2019. Results were: normal.  Prior pap in 2017 NILM but HPV HR + (no HPV typing).        OB History  Gravida Para Term Preterm AB Living  2 2 1 1 $ 0 2  SAB IAB Ectopic Multiple Live Births  0 0 0 0 2    # Outcome Date GA Lbr Len/2nd Weight Sex Delivery Anes PTL Lv  2 Term 2016 [redacted]w[redacted]d 7 lb 6 oz (3.345 kg) F CS-LTranv   LIV  1 Preterm 2011 324w0d6 lb 15 oz (3.147 kg) F Vag-Spont   LIV     Complications: Hx of maternal laceration, 4th degree, currently pregnant    Past Medical History:  Diagnosis Date   Allergy    Anemia    Chicken pox    CPD (cephalo-pelvic disproportion)    Depression    Dysmenorrhea    Family history of adverse reaction to anesthesia    PONV   Genital warts    GERD (gastroesophageal reflux disease)    Hydronephrosis    Hypoglycemia    Polycystic ovarian syndrome    PONV (postoperative nausea and vomiting)    Urinary incontinence    UTI (urinary tract infection)     Past Surgical History:  Procedure Laterality Date   APPENDECTOMY     CESAREAN SECTION     CHOLECYSTECTOMY  02/24/2009   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     LAPAROSCOPIC APPENDECTOMY N/A 12/24/2014   Procedure: APPENDECTOMY LAPAROSCOPIC;  Surgeon: ChClayburn PertMD;  Location: [facility];  Service: General;  Laterality: N/A;   LAPAROSCOPY   12/24/2014   Procedure: LAPAROSCOPY DIAGNOSTIC;  Surgeon: ChClayburn PertMD;  Location: [facility];  Service: General;;   partial oophorectomy     removed 1/2 of right ovary   RIGHT OOPHORECTOMY  05/19/2013   & bitaleral tubal removal   TONSILLECTOMY AND ADENOIDECTOMY  02/24/1986   utetral stent placement w/ removal      Family History  Problem Relation Age of Onset   Arthritis Mother    Mental illness Mother    Stroke Father    High blood pressure Father    Mental illness Father    Diabetes Father    Deep vein thrombosis Father    Pulmonary embolism Father    Heart attack Father    Breast cancer Maternal Aunt 55   Arthritis Maternal Grandmother    High blood pressure Maternal Grandmother    Diabetes Maternal Grandmother    Dementia Maternal Grandmother    High blood pressure Maternal Grandfather    Diabetes Maternal Grandfather    Arthritis Paternal Grandmother    High blood pressure Paternal Grandmother    Kidney disease Paternal Grandmother    Diabetes Paternal Grandmother    Mental illness Paternal Grandfather    Breast cancer Paternal AuAunt 57 Diabetes Daughter     Social History  Socioeconomic History   Marital status: Married    Spouse name: Not on file   Number of children: 1   Years of education: Not on file   Highest education level: Not on file  Occupational History   Not on file  Tobacco Use   Smoking status: Never   Smokeless tobacco: Never  Vaping Use   Vaping Use: Never used  Substance and Sexual Activity   Alcohol use: No    Alcohol/week: 0.0 standard drinks of alcohol    Comment: rare   Drug use: No   Sexual activity: Yes    Birth control/protection: Pill  Other Topics Concern   Not on file  Social History Narrative   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 21 capsule 2   diphenhydrAMINE (BENADRYL) 25 MG tablet Take 25 mg by mouth at bedtime as needed for allergies.     norethindrone-ethinyl estradiol-iron (AUROVELA FE 1.5/30) 1.5-30 MG-MCG tablet Take 1 tablet by mouth daily. Take 1 tablet by mouth daily, continuous method. Skip placebo pills. 112 tablet 3   pantoprazole (PROTONIX) 40 MG tablet TAKE 1 TABLET BY MOUTH EVERY DAY 30 tablet 1   sertraline (ZOLOFT) 50 MG tablet TAKE 1 TABLET BY MOUTH AT BEDTIME 90 tablet 0   triamcinolone (NASACORT) 55 MCG/ACT AERO nasal inhaler Place 2 sprays into the nose daily as needed (allergies).     No current facility-administered medications on file prior to visit.    Allergies  Allergen Reactions   Chlorhexidine Hives   Percocet [Oxycodone-Acetaminophen] Nausea And Vomiting    Severe vomiting      Review of Systems Constitutional: negative for chills, fatigue, fevers and sweats Eyes: negative for irritation, redness and visual disturbance Ears, nose, mouth, throat, and face: negative for hearing loss, nasal congestion, snoring and tinnitus Respiratory: negative for asthma, cough, sputum Cardiovascular: negative for chest pain, dyspnea, exertional chest pressure/discomfort, irregular heart beat, palpitations and syncope Gastrointestinal: negative for abdominal pain, change in bowel habits, nausea and vomiting Genitourinary: negative for abnormal menstrual periods, genital lesions, sexual problems and vaginal discharge, dysuria and urinary incontinence Integument/breast: negative for breast lump, breast tenderness and nipple discharge Hematologic/lymphatic: negative for bleeding and easy  bruising Musculoskeletal:negative for back pain and muscle weakness Neurological: negative for dizziness, headaches, vertigo and weakness Endocrine: negative for diabetic symptoms including polydipsia, polyuria and skin dryness Allergic/Immunologic: negative for hay fever and urticaria      Objective:  There were no vitals taken for this visit. There is no height or weight on file to calculate BMI.    General Appearance:    Alert, cooperative, no distress, appears stated age  Head:    Normocephalic, without obvious abnormality, atraumatic  Eyes:    PERRL, conjunctiva/corneas clear, EOM's intact, both eyes  Ears:    Normal external ear canals, both ears  Nose:   Nares normal, septum midline, mucosa normal, no drainage or sinus tenderness  Throat:  Lips, mucosa, and tongue normal; teeth and gums normal  Neck:   Supple, symmetrical, trachea midline, no adenopathy; thyroid: no enlargement/tenderness/nodules; no carotid bruit or JVD  Back:     Symmetric, no curvature, ROM normal, no CVA tenderness  Lungs:     Clear to auscultation bilaterally, respirations unlabored  Chest Wall:    No tenderness or deformity   Heart:    Regular rate and rhythm, S1 and S2 normal, no murmur, rub or gallop  Breast Exam:    No tenderness, masses, or nipple abnormality  Abdomen:     Soft, non-tender, bowel sounds active all four quadrants, no masses, no organomegaly.    Genitalia:    Pelvic:external genitalia normal, vagina without lesions, discharge, or tenderness, rectovaginal septum  normal. Cervix normal in appearance, no cervical motion tenderness, no adnexal masses or tenderness.  Uterus normal size, shape, mobile, regular contours, nontender.  Rectal:    Normal external sphincter.  No hemorrhoids appreciated. Internal exam not done.   Extremities:   Extremities normal, atraumatic, no cyanosis or edema  Pulses:   2+ and symmetric all extremities  Skin:   Skin color, texture, turgor normal, no rashes or  lesions  Lymph nodes:   Cervical, supraclavicular, and axillary nodes normal  Neurologic:   CNII-XII intact, normal strength, sensation and reflexes throughout   .  Labs:  Lab Results  Component Value Date   WBC 8.3 12/31/2020   HGB 14.1 12/31/2020   HCT 43.5 12/31/2020   MCV 87.5 12/31/2020   PLT 386 12/31/2020    Lab Results  Component Value Date   CREATININE 0.74 04/10/2020   BUN 15 04/10/2020   NA 138 04/10/2020   K 4.2 04/10/2020   CL 98 04/10/2020   CO2 22 04/10/2020    Lab Results  Component Value Date   ALT 14 04/10/2020   AST 18 04/10/2020   ALKPHOS 88 04/10/2020   BILITOT 0.3 04/10/2020    Lab Results  Component Value Date   TSH 1.090 04/10/2020     Assessment:   1. Encounter for well woman exam with routine gynecological exam   2. Encounter for screening mammogram for malignant neoplasm of breast   3. Screening cholesterol level   4. Screening for diabetes mellitus (DM)      Plan:  Blood tests: Ordered. Breast self exam technique reviewed and patient encouraged to perform self-exam monthly. Contraception: OCP (estrogen/progesterone). Discussed healthy lifestyle modifications. Mammogram  : Not age appropriate Pap smear  UTD . COVID vaccination status: Follow up in 1 year for annual exam   Rubie Maid, MD [place]

## 2022-04-15 ENCOUNTER — Ambulatory Visit (INDEPENDENT_AMBULATORY_CARE_PROVIDER_SITE_OTHER): Payer: BC Managed Care – PPO | Admitting: Obstetrics and Gynecology

## 2022-04-15 ENCOUNTER — Encounter: Payer: Self-pay | Admitting: Obstetrics and Gynecology

## 2022-04-15 VITALS — BP 127/76 | HR 79 | Resp 16 | Ht 60.0 in | Wt 219.4 lb

## 2022-04-15 DIAGNOSIS — Z01419 Encounter for gynecological examination (general) (routine) without abnormal findings: Secondary | ICD-10-CM | POA: Diagnosis not present

## 2022-04-15 DIAGNOSIS — Z131 Encounter for screening for diabetes mellitus: Secondary | ICD-10-CM

## 2022-04-15 DIAGNOSIS — Z13220 Encounter for screening for lipoid disorders: Secondary | ICD-10-CM

## 2022-04-15 DIAGNOSIS — Z1231 Encounter for screening mammogram for malignant neoplasm of breast: Secondary | ICD-10-CM

## 2022-04-15 DIAGNOSIS — N898 Other specified noninflammatory disorders of vagina: Secondary | ICD-10-CM

## 2022-04-15 DIAGNOSIS — R638 Other symptoms and signs concerning food and fluid intake: Secondary | ICD-10-CM

## 2022-04-15 DIAGNOSIS — E8940 Asymptomatic postprocedural ovarian failure: Secondary | ICD-10-CM

## 2022-07-31 ENCOUNTER — Encounter: Payer: Self-pay | Admitting: Obstetrics and Gynecology

## 2023-04-20 ENCOUNTER — Telehealth: Payer: Self-pay

## 2023-04-20 DIAGNOSIS — D369 Benign neoplasm, unspecified site: Secondary | ICD-10-CM

## 2023-04-20 MED ORDER — NORETHIN ACE-ETH ESTRAD-FE 1.5-30 MG-MCG PO TABS: 1.0000 | ORAL_TABLET | Freq: Every day | ORAL | 0 refills | Status: DC

## 2023-04-20 NOTE — Telephone Encounter (Signed)
 Chart reviewed. Patient has appointment 05/07/23 for annual. 1 pack sent with 0 refills.

## 2023-05-05 ENCOUNTER — Other Ambulatory Visit: Payer: Self-pay

## 2023-05-05 ENCOUNTER — Ambulatory Visit: Admitting: Obstetrics and Gynecology

## 2023-05-05 ENCOUNTER — Encounter: Payer: Self-pay | Admitting: Obstetrics and Gynecology

## 2023-05-05 VITALS — BP 109/59 | HR 80 | Ht 60.0 in | Wt 229.0 lb

## 2023-05-05 DIAGNOSIS — N6452 Nipple discharge: Secondary | ICD-10-CM | POA: Diagnosis not present

## 2023-05-05 DIAGNOSIS — Z803 Family history of malignant neoplasm of breast: Secondary | ICD-10-CM | POA: Diagnosis not present

## 2023-05-05 NOTE — Progress Notes (Signed)
    GYNECOLOGY PROGRESS NOTE  Subjective:    Patient ID: Martha Miranda, female    DOB: 11/21/1980, 43 y.o.   MRN: [id number]  HPI  Patient is a 43 y.o. G9P1102 female who presents for evaluation of nipple discharge. Her right nipple has been itching x 3 weeks with dryness. She has been using Aquafor for treatment. She noticed last night while she in the shower that pus  (greenish drainage) was coming out of her nipple. Her last mammogram was 07/07/2022 and it was negative. She has a family history of breast cancer, and has concerns. Has h/o normal mammogram on 07/07/2022.   The following portions of the patient's history were reviewed and updated as appropriate: allergies, current medications, past family history, past medical history, past social history, past surgical history, and problem list.  Review of Systems Pertinent items noted in HPI and remainder of comprehensive ROS otherwise negative.   Objective:   Blood pressure (!) 109/59, pulse 80, height 5' (1.524 m), weight 229 lb (103.9 kg). Body mass index is 44.72 kg/m. General appearance: alert and no distress Breasts: breasts appear normal, no suspicious masses,  no nipple changes or axillary nodes. Unable to express any drainage on exam today. Small amount of scaling around the right nipple).    Assessment:   1. Discharge from right nipple   2. Family history of breast cancer      Plan:   -Advised that breast findings were not concerning and most likely benign.  No concerns noted on exam today.  However in light of patient's family history we will order breast ultrasound.  Is also due for next mammogram in May of this year. Return to clinic for any scheduled appointments or for any gynecologic concerns as needed.   Hildred Laser, MD [facility]

## 2023-05-05 NOTE — Progress Notes (Signed)
 [facility] called requesting right sided diagnostic mammogram due to patients breast discharge. Order placed.

## 2023-05-06 ENCOUNTER — Encounter: Payer: Self-pay | Admitting: Obstetrics and Gynecology

## 2023-05-06 DIAGNOSIS — D369 Benign neoplasm, unspecified site: Secondary | ICD-10-CM

## 2023-05-06 MED ORDER — NORETHIN ACE-ETH ESTRAD-FE 1.5-30 MG-MCG PO TABS: 1.0000 | ORAL_TABLET | Freq: Every day | ORAL | 3 refills | Status: AC

## 2023-05-06 NOTE — Progress Notes (Deleted)
 GYNECOLOGY ANNUAL PHYSICAL EXAM PROGRESS NOTE  Subjective:    Martha Miranda is a 42 y.o. G48P1102 female who presents for an annual exam. 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 The patient is sexually active. The patient participates in regular exercise: no. Has the patient ever been transfused or tattooed?: no. The patient reports that there is not domestic violence in her life.   The patient has the following complaints today:   Menstrual History: Menarche age: 78 No LMP recorded. (Menstrual status: Oophorectomy).     Gynecologic History:  Contraception:  Surgical Menopause History of STI's: Denies Last Pap: 03/30/2019. Results were: normal.  Prior pap in 2017 NILM but HPV HR + (no HPV typing).    Last mammogram: 07/07/2022. Results were: normal       OB History  Gravida Para Term Preterm AB Living  2 2 1 1  0 2  SAB IAB Ectopic Multiple Live Births  0 0 0 0 2    # Outcome Date GA Lbr Len/2nd Weight Sex Type Anes PTL Lv  2 Term 2016 [redacted]w[redacted]d  7 lb 6 oz (3.345 kg) F CS-LTranv   LIV  1 Preterm 2011 [redacted]w[redacted]d  6 lb 15 oz (3.147 kg) F Vag-Spont   LIV     Complications: Hx of maternal laceration, 4th degree, currently pregnant    Past Medical History:  Diagnosis Date   Allergy    Anemia    Chicken pox    CPD (cephalo-pelvic disproportion)    Depression    Dysmenorrhea    Family history of adverse reaction to anesthesia    PONV   Genital warts    GERD (gastroesophageal reflux disease)    Hydronephrosis    Hypoglycemia    Polycystic ovarian syndrome    PONV (postoperative nausea and vomiting)    Urinary incontinence    UTI (urinary tract infection)     Past Surgical History:  Procedure Laterality Date   APPENDECTOMY     CESAREAN SECTION     CHOLECYSTECTOMY  02/24/2009   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     LAPAROSCOPIC APPENDECTOMY N/A 12/24/2014   Procedure: APPENDECTOMY LAPAROSCOPIC;  Surgeon: Ricarda Frame, MD;   Location: [facility];  Service: General;  Laterality: N/A;   LAPAROSCOPY  12/24/2014   Procedure: LAPAROSCOPY DIAGNOSTIC;  Surgeon: Ricarda Frame, MD;  Location: [facility] ORS;  Service: General;;   partial oophorectomy     removed 1/2 of right ovary   RIGHT OOPHORECTOMY  05/19/2013   & bitaleral tubal removal   TONSILLECTOMY AND ADENOIDECTOMY  02/24/1986   utetral stent placement w/ removal      Family History  Problem Relation Age of Onset   Arthritis Mother    Mental illness Mother    Stroke Father    High blood pressure Father    Mental illness Father    Diabetes Father    Deep vein thrombosis Father    Pulmonary embolism Father    Heart attack Father    Breast cancer Maternal Aunt 55   Arthritis Maternal Grandmother    High blood pressure Maternal Grandmother    Diabetes Maternal Grandmother    Dementia Maternal Grandmother    High blood pressure Maternal Grandfather    Diabetes Maternal Grandfather    Arthritis Paternal Grandmother    High blood pressure Paternal Grandmother    Kidney disease Paternal Grandmother    Diabetes Paternal Grandmother    Mental illness Paternal  Grandfather    Breast cancer Paternal Aunt 33   Diabetes Daughter     Social History   Socioeconomic History   Marital status: Married    Spouse name: Not on file   Number of children: 1   Years of education: Not on file   Highest education level: Not on file  Occupational History   Not on file  Tobacco Use   Smoking status: Never   Smokeless tobacco: Never  Vaping Use   Vaping status: Never Used  Substance and Sexual Activity   Alcohol use: No    Alcohol/week: 0.0 standard drinks of alcohol    Comment: rare   Drug use: No   Sexual activity: Yes    Birth control/protection: Pill  Other Topics Concern   Not on file  Social History Narrative   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            Social Drivers of Health   Financial Resource Strain: Low Risk  (09/29/2022)    Received from [facility]   Overall Financial Resource Strain (CARDIA)    Difficulty of Paying Living Expenses: Not hard at all  Food Insecurity: No Food Insecurity (09/29/2022)   Received from [facility]   Hunger Vital Sign    Worried About Running Out of Food in the Last Year: Never true    Ran Out of Food in the Last Year: Never true  Transportation Needs: No Transportation Needs (09/29/2022)   Received from [facility] - Transportation    Lack of Transportation (Medical): No    Lack of Transportation (Non-Medical): No  Physical Activity: Not on file  Stress: Not on file  Social Connections: Not on file  Intimate Partner Violence: Not on file    Current Outpatient Medications on File Prior to Visit  Medication Sig Dispense Refill   levocetirizine (XYZAL) 5 MG tablet Take 5 mg by mouth every evening.     norethindrone-ethinyl estradiol-iron (AUROVELA FE 1.5/30) 1.5-30 MG-MCG tablet Take 1 tablet by mouth daily. Take 1 tablet by mouth daily, continuous method. Skip placebo pills. 28 tablet 0   pantoprazole (PROTONIX) 40 MG tablet TAKE 1 TABLET BY MOUTH EVERY DAY 30 tablet 1   sertraline (ZOLOFT) 50 MG tablet TAKE 1 TABLET BY MOUTH AT BEDTIME 90 tablet 0   No current facility-administered medications on file prior to visit.    Allergies  Allergen Reactions   Chlorhexidine Hives   Percocet [Oxycodone-Acetaminophen] Nausea And Vomiting    Severe vomiting      Review of Systems Constitutional: negative for chills, fatigue, fevers and sweats Eyes: negative for irritation, redness and visual disturbance Ears, nose, mouth, throat, and face: negative for hearing loss, nasal congestion, snoring and tinnitus Respiratory: negative for asthma, cough, sputum Cardiovascular: negative for chest pain, dyspnea, exertional chest pressure/discomfort, irregular heart beat, palpitations and syncope Gastrointestinal: negative for abdominal pain, change in bowel habits,  nausea and vomiting Genitourinary: negative for abnormal menstrual periods, genital lesions, sexual problems and vaginal discharge, dysuria and urinary incontinence Integument/breast: negative for breast lump, breast tenderness and nipple discharge Hematologic/lymphatic: negative for bleeding and easy bruising Musculoskeletal:negative for back pain and muscle weakness Neurological: negative for dizziness, headaches, vertigo and weakness Endocrine: negative for diabetic symptoms including polydipsia, polyuria and skin dryness Allergic/Immunologic: negative for hay fever and urticaria      Objective:  There were no vitals taken for this visit. There is no height or  weight on file to calculate BMI.    General Appearance:    Alert, cooperative, no distress, appears stated age  Head:    Normocephalic, without obvious abnormality, atraumatic  Eyes:    PERRL, conjunctiva/corneas clear, EOM's intact, both eyes  Ears:    Normal external ear canals, both ears  Nose:   Nares normal, septum midline, mucosa normal, no drainage or sinus tenderness  Throat:   Lips, mucosa, and tongue normal; teeth and gums normal  Neck:   Supple, symmetrical, trachea midline, no adenopathy; thyroid: no enlargement/tenderness/nodules; no carotid bruit or JVD  Back:     Symmetric, no curvature, ROM normal, no CVA tenderness  Lungs:     Clear to auscultation bilaterally, respirations unlabored  Chest Wall:    No tenderness or deformity   Heart:    Regular rate and rhythm, S1 and S2 normal, no murmur, rub or gallop  Breast Exam:    No tenderness, masses, or nipple abnormality  Abdomen:     Soft, non-tender, bowel sounds active all four quadrants, no masses, no organomegaly.    Genitalia:    Pelvic:external genitalia normal, vagina without lesions, discharge, or tenderness, rectovaginal septum  normal. Cervix normal in appearance, no cervical motion tenderness, no adnexal masses or tenderness.  Uterus normal size, shape,  mobile, regular contours, nontender.  Rectal:    Normal external sphincter.  No hemorrhoids appreciated. Internal exam not done.   Extremities:   Extremities normal, atraumatic, no cyanosis or edema  Pulses:   2+ and symmetric all extremities  Skin:   Skin color, texture, turgor normal, no rashes or lesions  Lymph nodes:   Cervical, supraclavicular, and axillary nodes normal  Neurologic:   CNII-XII intact, normal strength, sensation and reflexes throughout   .  Labs:  Lab Results  Component Value Date   WBC 8.3 12/31/2020   HGB 14.1 12/31/2020   HCT 43.5 12/31/2020   MCV 87.5 12/31/2020   PLT 386 12/31/2020    Lab Results  Component Value Date   CREATININE 0.74 04/10/2020   BUN 15 04/10/2020   NA 138 04/10/2020   K 4.2 04/10/2020   CL 98 04/10/2020   CO2 22 04/10/2020    Lab Results  Component Value Date   ALT 14 04/10/2020   AST 18 04/10/2020   ALKPHOS 88 04/10/2020   BILITOT 0.3 04/10/2020    Lab Results  Component Value Date   TSH 1.090 04/10/2020     Assessment:   1. Encounter for well woman exam with routine gynecological exam   2. Encounter for screening mammogram for malignant neoplasm of breast   3. Cervical cancer screening   4. Hypoglycemia   5. Family history of breast cancer   6. Increased BMI (body mass index)   7. Screening cholesterol level   8. Screening for diabetes mellitus (DM)      Plan:  Blood tests: Pending. Breast self exam technique reviewed and patient encouraged to perform self-exam monthly. Contraception:  Surgical Menopause . Discussed healthy lifestyle modifications. Mammogram ordered Pap smear ordered. Flu vaccine: Declined Follow up in 1 year for annual exam   Hildred Laser, MD [facility]

## 2023-05-07 ENCOUNTER — Ambulatory Visit: Payer: BC Managed Care – PPO | Admitting: Obstetrics and Gynecology
